# Patient Record
Sex: Female | Born: 1995
Health system: Southern US, Community
[De-identification: ages and names within clinical notes are randomized; demographics above are authoritative.]

## PROBLEM LIST (undated history)

## (undated) DIAGNOSIS — F411 Generalized anxiety disorder: Secondary | ICD-10-CM

## (undated) DIAGNOSIS — G43909 Migraine, unspecified, not intractable, without status migrainosus: Secondary | ICD-10-CM

## (undated) HISTORY — DX: Migraine, unspecified, not intractable, without status migrainosus: G43.909

## (undated) HISTORY — DX: Generalized anxiety disorder: F41.1

---
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
## 2017-01-18 DIAGNOSIS — Z23 Encounter for immunization: Secondary | ICD-10-CM | POA: Diagnosis not present

## 2017-01-22 DIAGNOSIS — J209 Acute bronchitis, unspecified: Secondary | ICD-10-CM | POA: Diagnosis not present

## 2017-01-22 DIAGNOSIS — R509 Fever, unspecified: Secondary | ICD-10-CM | POA: Diagnosis not present

## 2017-02-08 DIAGNOSIS — L298 Other pruritus: Secondary | ICD-10-CM | POA: Diagnosis not present

## 2017-02-08 DIAGNOSIS — L853 Xerosis cutis: Secondary | ICD-10-CM | POA: Diagnosis not present

## 2017-03-04 ENCOUNTER — Ambulatory Visit: Payer: Self-pay | Admitting: Family Medicine

## 2017-03-05 ENCOUNTER — Ambulatory Visit: Payer: 59 | Admitting: Internal Medicine

## 2017-03-05 ENCOUNTER — Encounter: Payer: Self-pay | Admitting: Internal Medicine

## 2017-03-05 VITALS — BP 110/66 | HR 86 | Temp 98.2°F | Wt 114.0 lb

## 2017-03-05 DIAGNOSIS — L03032 Cellulitis of left toe: Secondary | ICD-10-CM | POA: Diagnosis not present

## 2017-03-05 MED ORDER — CEPHALEXIN 500 MG PO CAPS: 500.0000 mg | ORAL_CAPSULE | Freq: Three times a day (TID) | ORAL | 0 refills | Status: DC

## 2017-03-05 NOTE — Patient Instructions (Signed)
Paronychia  Paronychia is an infection of the skin. It happens near a fingernail or toenail. It may cause pain and swelling around the nail. Usually, it is not serious and it clears up with treatment.  Follow these instructions at home:   Soak the fingers or toes in warm water as told by your doctor. You may be told to do this for 20 minutes, 2-3 times a day.   Keep the area dry when you are not soaking it.   Take medicines only as told by your doctor.   If you were given an antibiotic medicine, finish all of it even if you start to feel better.   Keep the affected area clean.   Do not try to drain a fluid-filled bump yourself.   Wear rubber gloves when putting your hands in water.   Wear gloves if your hands might touch cleaners or chemicals.   Follow your doctor's instructions about:  ? Wound care.  ? Bandage (dressing) changes and removal.  Contact a doctor if:   Your symptoms get worse or do not improve.   You have a fever or chills.   You have redness spreading from the affected area.   You have more fluid, blood, or pus coming from the affected area.   Your finger or knuckle is swollen or is hard to move.  This information is not intended to replace advice given to you by your health care provider. Make sure you discuss any questions you have with your health care provider.  Document Released: 03/28/2009 Document Revised: 09/15/2015 Document Reviewed: 03/17/2014  Elsevier Interactive Patient Education  2018 Elsevier Inc.

## 2017-03-05 NOTE — Progress Notes (Signed)
Subjective:    Patient ID: Ebony White, female    DOB: 07/14/1995, 21 y.o.   MRN: [id number]  HPI  Pt presents to the clinic today with c/o redness, swelling and discharge of her 4th toe, left foot. This started 2 days ago. She reports the discharge is yellow in color. The area is very itchy. She has tried antibiotic ointment with minimal relief.  Review of Systems  Past Medical History:  Diagnosis Date  . GAD (generalized anxiety disorder)   . Migraines     Current Outpatient Medications  Medication Sig Dispense Refill  . JUNEL FE 1/20 1-20 MG-MCG tablet      No current facility-administered medications for this visit.     No Known Allergies  Family History  Problem Relation Age of Onset  . Anxiety disorder Mother   . Migraines Mother   . Hypertension Mother   . Diabetes Father   . Anxiety disorder Maternal Grandmother   . Hypertension Maternal Grandfather   . Lung cancer Maternal Grandfather   . Diabetes Paternal Grandmother   . Ovarian cancer Paternal Grandmother     Social History   Socioeconomic History  . Marital status: Single    Spouse name: Not on file  . Number of children: Not on file  . Years of education: Not on file  . Highest education level: Not on file  Social Needs  . Financial resource strain: Not on file  . Food insecurity - worry: Not on file  . Food insecurity - inability: Not on file  . Transportation needs - medical: Not on file  . Transportation needs - non-medical: Not on file  Occupational History  . Not on file  Tobacco Use  . Smoking status: Never Smoker  . Smokeless tobacco: Never Used  Substance and Sexual Activity  . Alcohol use: No  . Drug use: No  . Sexual activity: No  Other Topics Concern  . Not on file  Social History Narrative   Single.    No children.   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   Works [place] [employer] and at [employer].     Constitutional: Denies  fever, malaise, fatigue, headache or abrupt weight changes.   Skin: Pt reports redness, swelling and redness around nailbed of 4th toe, left foot.    No other specific complaints in a complete review of systems (except as listed in HPI above).     Objective:   Physical Exam   BP 110/66   Pulse 86   Temp 98.2 F (36.8 C) (Oral)   Wt 114 lb (51.7 kg)   LMP 02/22/2017   SpO2 98%   BMI 19.27 kg/m  Wt Readings from Last 3 Encounters:  03/05/17 114 lb (51.7 kg) (25 %, Z= -0.68)*  05/28/16 109 lb (49.4 kg) (18 %, Z= -0.92)*  05/26/15 113 lb 5.1 oz (51.4 kg) (31 %, Z= -0.49)*   * Growth percentiles are based on CDC (Girls, 2-20 Years) data.    General: Appears her stated age, well developed, well nourished in NAD. Skin: Paronychia noted of 4th toe, left great toenail.  BMET    Component Value Date/Time   NA 140 05/26/2015 1312   K 4.1 05/26/2015 1312   CL 107 05/26/2015 1312   CO2 23 05/26/2015 1312   GLUCOSE 86 05/26/2015 1312   BUN 10 05/26/2015 1312   CREATININE 0.87 05/26/2015 1312   CALCIUM 8.9 05/26/2015  1312   GFRNONAA NOT CALCULATED 05/26/2015 1312   GFRAA NOT CALCULATED 05/26/2015 1312    Lipid Panel  No results found for: CHOL, TRIG, HDL, CHOLHDL, VLDL, LDLCALC  CBC    Component Value Date/Time   WBC 14.1 (H) 05/26/2015 1312   RBC 4.71 05/26/2015 1312   HGB 13.2 05/26/2015 1312   HCT 39.5 05/26/2015 1312   PLT 234 05/26/2015 1312   MCV 83.9 05/26/2015 1312   MCH 28.0 05/26/2015 1312   MCHC 33.4 05/26/2015 1312   RDW 12.7 05/26/2015 1312   LYMPHSABS 0.5 (L) 05/26/2015 1312   MONOABS 0.9 05/26/2015 1312   EOSABS 0.1 05/26/2015 1312   BASOSABS 0.0 05/26/2015 1312    Hgb A1C No results found for: HGBA1C         Assessment & Plan:   Paronychia: Left Foot:  Soak in Epsom salts 2 x day eRx for Keflex 500 mg TID x 7 days  Return precautions discussed Nicki ReaperBAITY, Medford Staheli, NP

## 2017-03-08 ENCOUNTER — Encounter: Payer: Self-pay | Admitting: Primary Care

## 2017-03-08 ENCOUNTER — Ambulatory Visit (INDEPENDENT_AMBULATORY_CARE_PROVIDER_SITE_OTHER): Payer: 59 | Admitting: Primary Care

## 2017-03-08 VITALS — BP 104/64 | HR 70 | Temp 98.5°F | Ht 64.5 in | Wt 114.4 lb

## 2017-03-08 DIAGNOSIS — F411 Generalized anxiety disorder: Secondary | ICD-10-CM

## 2017-03-08 MED ORDER — ESCITALOPRAM OXALATE 5 MG PO TABS: 5.0000 mg | ORAL_TABLET | Freq: Every day | ORAL | 1 refills | Status: DC

## 2017-03-08 NOTE — Assessment & Plan Note (Signed)
Did okay with therapy, needs someone closer. [place] in [place]. She'd also like to try medication, discussed different options. GAD 7 score of 11 today.   Rx for Lexapro 5 mg sent to pharmacy. Patient is to take 1/2 tablet daily for 8 days, then advance to 1 full tablet thereafter. We discussed possible side effects of headache, GI upset, drowsiness, and SI/HI. If thoughts of SI/HI develop, we discussed to present to the emergency immediately. Patient verbalized understanding.   Follow up in 6 weeks for re-evaluation.

## 2017-03-08 NOTE — Patient Instructions (Signed)
Stop by the front desk and speak with either Shirlee LimerickMarion or Zella BallRobin regarding your referral to therapy. Explain to them that you are already seeing Bambi and that you need someone closer.   Start escitalopram (Lexapro) 5 mg tablets for anxiety. Take 1 tablet by mouth once daily.  Schedule a follow up visit with me in 6 weeks for re-evaluation.  It was a pleasure to see you today!

## 2017-03-08 NOTE — Progress Notes (Signed)
Subjective:    Patient ID: Ebony White, female    DOB: 05/20/1995, 21 y.o.   MRN: [id number]  HPI  Ebony White is an 21 year old female with a history of anxiety disorder who presents today with anxiety.  She was last evaluated in February 2018 as a new patient who endorsed strong family history of GAD. Also with symptoms of anxiety with a GAD 7 score of 11 that day. We decided that she would benefit from therapy so a referral was placed.   Since her last visit she's seen a therapist. She went for 5-6 sessions and felt like she was making small improvements, but [place] now [place] Consulting [place] and [place] needing someone closer. Her last visit with the therapist was in April 2018. Her therapist thinks she has slight OCD symptoms. She continues to feel anxious, trembling of her hand, pulls her hair often. Symptoms are daily to every other day. GAD 7 score of 11 today. She denies depression, SI/HI.  Review of Systems  Respiratory: Negative for shortness of breath.   Cardiovascular: Negative for chest pain.  Psychiatric/Behavioral: Positive for sleep disturbance. The patient is nervous/anxious.        Past Medical History:  Diagnosis Date  . GAD (generalized anxiety disorder)   . Migraines      Social History   Socioeconomic History  . Marital status: Single    Spouse name: Not on file  . Number of children: Not on file  . Years of education: Not on file  . Highest education level: Not on file  Social Needs  . Financial resource strain: Not on file  . Food insecurity - worry: Not on file  . Food insecurity - inability: Not on file  . Transportation needs - medical: Not on file  . Transportation needs - non-medical: Not on file  Occupational History  . Not on file  Tobacco Use  . Smoking status: Never Smoker  . Smokeless tobacco: Never Used  Substance and Sexual Activity  . Alcohol use: No  . Drug use: No  . Sexual activity: No  Other Topics Concern  . Not on file    Social History Narrative   Single.    No children.   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   Works [place] [employer] and at [employer].    Past Surgical History:  Procedure Laterality Date  . APPENDECTOMY  2012    Family History  Problem Relation Age of Onset  . Anxiety disorder Mother   . Migraines Mother   . Hypertension Mother   . Diabetes Father   . Anxiety disorder Maternal Grandmother   . Hypertension Maternal Grandfather   . Lung cancer Maternal Grandfather   . Diabetes Paternal Grandmother   . Ovarian cancer Paternal Grandmother     No Known Allergies  Current Outpatient Medications on File Prior to Visit  Medication Sig Dispense Refill  . cephALEXin (KEFLEX) 500 MG capsule Take 1 capsule (500 mg total) 3 (three) times daily by mouth. 21 capsule 0  . JUNEL FE 1/20 1-20 MG-MCG tablet     . valACYclovir (VALTREX) 500 MG tablet Take 500 mg 2 (two) times daily by mouth.     No current facility-administered medications on file prior to visit.     BP 104/64   Pulse 70   Temp 98.5 F (36.9 C) (Oral)   Ht 5' 4.5" (1.638 m)  Wt 114 lb 6.4 oz (51.9 kg)   LMP 02/22/2017   SpO2 99%   BMI 19.33 kg/m    Objective:   Physical Exam  Constitutional: She appears well-nourished.  Neck: Neck supple.  Cardiovascular: Normal rate and regular rhythm.  Pulmonary/Chest: Effort normal and breath sounds normal.  Skin: Skin is warm and dry.  Psychiatric: She has a normal mood and affect.          Assessment & Plan:

## 2017-04-19 DIAGNOSIS — N760 Acute vaginitis: Secondary | ICD-10-CM | POA: Diagnosis not present

## 2017-04-24 ENCOUNTER — Ambulatory Visit: Payer: Self-pay | Admitting: Primary Care

## 2017-04-26 ENCOUNTER — Ambulatory Visit: Payer: 59 | Admitting: Psychology

## 2017-05-01 ENCOUNTER — Encounter: Payer: Self-pay | Admitting: Primary Care

## 2017-05-01 ENCOUNTER — Ambulatory Visit (INDEPENDENT_AMBULATORY_CARE_PROVIDER_SITE_OTHER): Payer: 59 | Admitting: Primary Care

## 2017-05-01 DIAGNOSIS — F411 Generalized anxiety disorder: Secondary | ICD-10-CM | POA: Diagnosis not present

## 2017-05-01 NOTE — Assessment & Plan Note (Signed)
Improved on Lexapro 5 mg. Recommended she switch her dose to the evening to avoid daytime drowsiness. Initiate with therapy as planned. She will update if drowsiness persists.

## 2017-05-01 NOTE — Progress Notes (Signed)
Subjective:    Patient ID: Ebony White, female    DOB: 04/10/1996, 22 y.o.   MRN: [id number]  HPI  Ms. Ebony White is a 22 year old female who presents today for follow up of anxiety.   She was last evaluated on 03/08/17 with complaints of anxiety.  GAD 7 score of 11 that day. She had been evaluated in February 2018 with symptoms of anxiety but believed she was able to manage well on her own. Given continued symptoms since February 2018 she was treated with Lexapro 5 mg and therapy.  Since her last visit she's not see the therapist, she is scheduled for her first visit this week. She thinks she's feeling better. She doesn't pick and pull at her hair as much, she doesn't shake as much. She has noticed increased drowsiness, she is taking her Lexapro in the morning. She denies SI/HI, GI upset.  2) Traveler Vaccination: She will be traveling to [place] in May [place] and is wondering about specific vaccinations. She will need Hepatitis A and Typhoid. She will be leaving May 11th. Last Tdap was 2011.  Review of Systems  Cardiovascular: Negative for chest pain and palpitations.  Psychiatric/Behavioral: Negative for suicidal ideas.       See HPI     Past Medical History:  Diagnosis Date  . GAD (generalized anxiety disorder)   . Migraines      Social History   Socioeconomic History  . Marital status: Single    Spouse name: Not on file  . Number of children: Not on file  . Years of education: Not on file  . Highest education level: Not on file  Social Needs  . Financial resource strain: Not on file  . Food insecurity - worry: Not on file  . Food insecurity - inability: Not on file  . Transportation needs - medical: Not on file  . Transportation needs - non-medical: Not on file  Occupational History  . Not on file  Tobacco Use  . Smoking status: Never Smoker  . Smokeless tobacco: Never Used  Substance and Sexual Activity  . Alcohol use: No  . Drug use: No  . Sexual activity: No    Other Topics Concern  . Not on file  Social History Narrative   Single.    No children.   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   Works [place] [employer] [place] and at [employer].    Past Surgical History:  Procedure Laterality Date  . APPENDECTOMY  2012    Family History  Problem Relation Age of Onset  . Anxiety disorder Mother   . Migraines Mother   . Hypertension Mother   . Diabetes Father   . Anxiety disorder Maternal Grandmother   . Hypertension Maternal Grandfather   . Lung cancer Maternal Grandfather   . Diabetes Paternal Grandmother   . Ovarian cancer Paternal Grandmother     No Known Allergies  Current Outpatient Medications on File Prior to Visit  Medication Sig Dispense Refill  . escitalopram (LEXAPRO) 5 MG tablet Take 1 tablet (5 mg total) daily by mouth. 30 tablet 1  . JUNEL FE 1/20 1-20 MG-MCG tablet     . metroNIDAZOLE (FLAGYL) 500 MG tablet Take 1 tablet by mouth 2 (two) times daily.  0  . valACYclovir (VALTREX) 500 MG tablet Take 500 mg 2 (two) times daily by mouth.     No current facility-administered medications on file prior to  visit.     BP (!) 98/56 (BP Location: Right Arm, Patient Position: Sitting, Cuff Size: Normal)   Pulse 66   Temp 98.2 F (36.8 C) (Oral)   Wt 114 lb 4 oz (51.8 kg)   LMP 04/03/2017   SpO2 98%   BMI 19.31 kg/m    Objective:   Physical Exam  Constitutional: She appears well-nourished.  Neck: Neck supple.  Cardiovascular: Normal rate and regular rhythm.  Pulmonary/Chest: Effort normal and breath sounds normal.  Skin: Skin is warm and dry.  Psychiatric: She has a normal mood and affect.          Assessment & Plan:  International Travel:  Will be going to [place] on May 11th. Based off of the CDC website she'll need Hepatitis A and Typhoid vaccination. Can do typhoid orally. All other vaccinations UTD. She will schedule a nurse visit 6 weeks prior to her  vacation.  Morrie Sheldon, NP

## 2017-05-01 NOTE — Patient Instructions (Signed)
Continue Lexapro 5 mg as discussed, try taking this in the evening.  Schedule a nurse visit for the first week in April 2019 for your vaccinations.   It was a pleasure to see you today!

## 2017-05-07 ENCOUNTER — Other Ambulatory Visit: Payer: Self-pay | Admitting: Primary Care

## 2017-05-07 DIAGNOSIS — F411 Generalized anxiety disorder: Secondary | ICD-10-CM

## 2017-06-04 ENCOUNTER — Telehealth: Payer: Self-pay | Admitting: Primary Care

## 2017-06-04 NOTE — Telephone Encounter (Signed)
Copied from CRM #[id number]. Topic: General - Other >> Jun 04, 2017  2:53 PM Cecelia ByarsGreen, Temeka L, RMA wrote: Reason for CRM: patient called to notify Dr. Chestine Sporelark that her insurance is requesting a prior authorization for the typhoid injection

## 2017-06-04 NOTE — Telephone Encounter (Signed)
FYI..Patient wanted to let Jae DireKate know that she will need a prior auth for the typhoid pills  Patient stated that she will call about a week for her nurse visit in May to give us time to do the prior auth

## 2017-06-05 NOTE — Telephone Encounter (Signed)
Noted, please notify patient that she'll have to have the typhoid pills one week prior to travel, so I recommend she give [place] at least three weeks notice prior to travel (2 weeks for PA, 1 week for her to take medication prior to travel). Also needs Hepatitis A vaccination which we have in the office.

## 2017-06-05 NOTE — Telephone Encounter (Signed)
Spoken and notified patient of Kate's comments. Patient verbalized understanding. 

## 2017-06-14 ENCOUNTER — Telehealth: Payer: Self-pay | Admitting: *Deleted

## 2017-06-14 NOTE — Telephone Encounter (Signed)
Please notify patient that I'm always happy to discuss during an office visit, please schedule at her convenience. I need more information.

## 2017-06-14 NOTE — Telephone Encounter (Signed)
Message left for patient to return my call.  

## 2017-06-14 NOTE — Telephone Encounter (Signed)
Copied from CRM [phone]. Topic: Referral - Request >> Jun 14, 2017 12:50 PM Ebony KalataMichael, [name], NT wrote: Patient is calling states she would like a referral to a specialist (not sure what kind). 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 Please contact patient for further information.

## 2017-06-18 NOTE — Telephone Encounter (Signed)
Patient advised and will call back tomorrow for an appointment.

## 2017-07-12 ENCOUNTER — Encounter: Payer: Self-pay | Admitting: Family Medicine

## 2017-07-12 ENCOUNTER — Ambulatory Visit (INDEPENDENT_AMBULATORY_CARE_PROVIDER_SITE_OTHER): Payer: 59 | Admitting: Family Medicine

## 2017-07-12 VITALS — BP 116/80 | HR 102 | Temp 102.5°F | Ht 64.51 in | Wt 116.4 lb

## 2017-07-12 DIAGNOSIS — R509 Fever, unspecified: Secondary | ICD-10-CM

## 2017-07-12 DIAGNOSIS — B349 Viral infection, unspecified: Secondary | ICD-10-CM

## 2017-07-12 LAB — POC INFLUENZA A&B (BINAX/QUICKVUE)
INFLUENZA A, POC: NEGATIVE
Influenza B, POC: NEGATIVE

## 2017-07-12 MED ORDER — OSELTAMIVIR PHOSPHATE 75 MG PO CAPS: 75.0000 mg | ORAL_CAPSULE | Freq: Two times a day (BID) | ORAL | 0 refills | Status: AC

## 2017-07-12 MED ORDER — PROMETHAZINE-DM 6.25-15 MG/5ML PO SYRP: 5.0000 mL | ORAL_SOLUTION | Freq: Four times a day (QID) | ORAL | 0 refills | Status: DC | PRN

## 2017-07-12 NOTE — Progress Notes (Signed)
Subjective:  Patient ID: Ebony White, female    DOB: 11/23/1995  Age: 22 y.o. MRN: [id number]  CC: Fever and Cough   HPI Ebony White presents for evaluation of a 1 day history of fever chills, headache nasal congestion, sore throat, cough myalgias and nausea.  She has no asthma history.  She works in a pharmacy.  She has 1 dose of Tylenol for this this morning.  She has had no facial pressure, teeth pain or wheezing.  Outpatient Medications Prior to Visit  Medication Sig Dispense Refill  . escitalopram (LEXAPRO) 5 MG tablet TAKE 1 TABLET (5 MG TOTAL) DAILY BY MOUTH. 30 tablet 1  . JUNEL FE 1/20 1-20 MG-MCG tablet     . metroNIDAZOLE (FLAGYL) 500 MG tablet Take 1 tablet by mouth 2 (two) times daily.  0  . valACYclovir (VALTREX) 500 MG tablet Take 500 mg 2 (two) times daily by mouth.     No facility-administered medications prior to visit.     ROS Review of Systems  Constitutional: Positive for chills, fatigue and fever. Negative for unexpected weight change.  HENT: Positive for congestion, postnasal drip, rhinorrhea and sore throat. Negative for ear pain, facial swelling, sinus pressure and sinus pain.   Eyes: Negative for photophobia and visual disturbance.  Respiratory: Positive for cough. Negative for chest tightness, shortness of breath and wheezing.   Cardiovascular: Negative.   Gastrointestinal: Positive for nausea. Negative for abdominal pain and vomiting.  Genitourinary: Negative.   Musculoskeletal: Positive for myalgias. Negative for arthralgias.  Skin: Negative for rash.  Allergic/Immunologic: Negative for immunocompromised state.  Neurological: Positive for headaches. Negative for weakness.  Hematological: Does not bruise/bleed easily.  Psychiatric/Behavioral: Negative.     Objective:  BP 116/80 (BP Location: Left Arm, Patient Position: Sitting, Cuff Size: Normal)   Pulse (!) 102   Temp (!) 102.5 F (39.2 C) (Oral)   Ht 5' 4.51" (1.639 m)   Wt 116 lb 6  oz (52.8 kg)   SpO2 97%   BMI 19.66 kg/m   BP Readings from Last 3 Encounters:  07/12/17 116/80  05/01/17 (!) 98/56  03/08/17 104/64    Wt Readings from Last 3 Encounters:  07/12/17 116 lb 6 oz (52.8 kg) (28 %, Z= -0.58)*  05/01/17 114 lb 4 oz (51.8 kg) (25 %, Z= -0.69)*  03/08/17 114 lb 6.4 oz (51.9 kg) (26 %, Z= -0.66)*   * Growth percentiles are based on CDC (Girls, 2-20 Years) data.    Physical Exam  Constitutional: She is oriented to person, place, and time. She appears well-developed and well-nourished. No distress.  HENT:  Head: Normocephalic and atraumatic.  Right Ear: External ear normal.  Left Ear: External ear normal.  Mouth/Throat: Oropharynx is clear and moist. No oropharyngeal exudate.  Eyes: Pupils are equal, round, and reactive to light. Right eye exhibits no discharge. Left eye exhibits no discharge. No scleral icterus.  Neck: Neck supple. No JVD present. No tracheal deviation present. No thyromegaly present.  Cardiovascular: Normal rate, regular rhythm and normal heart sounds.  Pulmonary/Chest: Effort normal and breath sounds normal. No stridor.  Abdominal: Bowel sounds are normal.  Lymphadenopathy:    She has no cervical adenopathy.  Neurological: She is alert and oriented to person, place, and time.  Skin: Skin is warm and dry. No rash noted. She is not diaphoretic.  Psychiatric: She has a normal mood and affect. Her behavior is normal.    Lab Results  Component Value Date  WBC 14.1 (H) 05/26/2015   HGB 13.2 05/26/2015   HCT 39.5 05/26/2015   PLT 234 05/26/2015   GLUCOSE 86 05/26/2015   ALT 14 05/26/2015   AST 22 05/26/2015   NA 140 05/26/2015   K 4.1 05/26/2015   CL 107 05/26/2015   CREATININE 0.87 05/26/2015   BUN 10 05/26/2015   CO2 23 05/26/2015    Dg Hand Complete Right  Result Date: 11/24/2016 CLINICAL DATA:  Right hand injury. EXAM: RIGHT HAND - COMPLETE 3+ VIEW COMPARISON:  None. FINDINGS: There is no evidence of fracture or  dislocation. There is no evidence of arthropathy or other focal bone abnormality. Soft tissues are unremarkable. IMPRESSION: Negative. Electronically Signed   By: Bary Richard M.D.   On: 11/24/2016 20:05    Assessment & Plan:   [name] was seen today for fever and cough.  Diagnoses and all orders for this visit:  Fever, unspecified fever cause -     POC Influenza A&B(BINAX/QUICKVUE)  Viral syndrome -     oseltamivir (TAMIFLU) 75 MG capsule; Take 1 capsule (75 mg total) by mouth 2 (two) times daily for 5 days. -     promethazine-dextromethorphan (PROMETHAZINE-DM) 6.25-15 MG/5ML syrup; Take 5 mLs by mouth 4 (four) times daily as needed for cough.   I am having Ebony White start on oseltamivir and promethazine-dextromethorphan. I am also having her maintain her JUNEL FE 1/20, valACYclovir, metroNIDAZOLE, and escitalopram.  Meds ordered this encounter  Medications  . oseltamivir (TAMIFLU) 75 MG capsule    Sig: Take 1 capsule (75 mg total) by mouth 2 (two) times daily for 5 days.    Dispense:  10 capsule    Refill:  0  . promethazine-dextromethorphan (PROMETHAZINE-DM) 6.25-15 MG/5ML syrup    Sig: Take 5 mLs by mouth 4 (four) times daily as needed for cough.    Dispense:  118 mL    Refill:  0     Follow-up: Return in about 1 week (around 07/19/2017), or if symptoms worsen or fail to improve.  Mliss Sax, MD

## 2017-07-12 NOTE — Patient Instructions (Signed)
Viral Illness, Adult Viruses are tiny germs that can get into a person's body and cause illness. There are many different types of viruses, and they cause many types of illness. Viral illnesses can range from mild to severe. They can affect various parts of the body. Common illnesses that are caused by a virus include colds and the flu. Viral illnesses also include serious conditions such as HIV/AIDS (human immunodeficiency virus/acquired immunodeficiency syndrome). A few viruses have been linked to certain cancers. What are the causes? Many types of viruses can cause illness. Viruses invade cells in your body, multiply, and cause the infected cells to malfunction or die. When the cell dies, it releases more of the virus. When this happens, you develop symptoms of the illness, and the virus continues to spread to other cells. If the virus takes over the function of the cell, it can cause the cell to divide and grow out of control, as is the case when a virus causes cancer. Different viruses get into the body in different ways. You can get a virus by:  Swallowing food or water that is contaminated with the virus.  Breathing in droplets that have been coughed or sneezed into the air by an infected person.  Touching a surface that has been contaminated with the virus and then touching your eyes, nose, or mouth.  Being bitten by an insect or animal that carries the virus.  Having sexual contact with a person who is infected with the virus.  Being exposed to blood or fluids that contain the virus, either through an open cut or during a transfusion.  If a virus enters your body, your body's defense system (immune system) will try to fight the virus. You may be at higher risk for a viral illness if your immune system is weak. What are the signs or symptoms? Symptoms vary depending on the type of virus and the location of the cells that it invades. Common symptoms of the main types of viral illnesses  include: Cold and flu viruses  Fever.  Headache.  Sore throat.  Muscle aches.  Nasal congestion.  Cough. Digestive system (gastrointestinal) viruses  Fever.  Abdominal pain.  Nausea.  Diarrhea. Liver viruses (hepatitis)  Loss of appetite.  Tiredness.  Yellowing of the skin (jaundice). Brain and spinal cord viruses  Fever.  Headache.  Stiff neck.  Nausea and vomiting.  Confusion or sleepiness. Skin viruses  Warts.  Itching.  Rash. Sexually transmitted viruses  Discharge.  Swelling.  Redness.  Rash. How is this treated? Viruses can be difficult to treat because they live within cells. Antibiotic medicines do not treat viruses because these drugs do not get inside cells. Treatment for a viral illness may include:  Resting and drinking plenty of fluids.  Medicines to relieve symptoms. These can include over-the-counter medicine for pain and fever, medicines for cough or congestion, and medicines to relieve diarrhea.  Antiviral medicines. These drugs are available only for certain types of viruses. They may help reduce flu symptoms if taken early. There are also many antiviral medicines for hepatitis and HIV/AIDS.  Some viral illnesses can be prevented with vaccinations. A common example is the flu shot. Follow these instructions at home: Medicines   Take over-the-counter and prescription medicines only as told by your health care provider.  If you were prescribed an antiviral medicine, take it as told by your health care provider. Do not stop taking the medicine even if you start to feel better.  Be aware   of when antibiotics are needed and when they are not needed. Antibiotics do not treat viruses. If your health care provider thinks that you may have a bacterial infection as well as a viral infection, you may get an antibiotic. ? Do not ask for an antibiotic prescription if you have been diagnosed with a viral illness. That will not make your  illness go away faster. ? Frequently taking antibiotics when they are not needed can lead to antibiotic resistance. When this develops, the medicine no longer works against the bacteria that it normally fights. General instructions  Drink enough fluids to keep your urine clear or pale yellow.  Rest as much as possible.  Return to your normal activities as told by your health care provider. Ask your health care provider what activities are safe for you.  Keep all follow-up visits as told by your health care provider. This is important. How is this prevented? Take these actions to reduce your risk of viral infection:  Eat a healthy diet and get enough rest.  Wash your hands often with soap and water. This is especially important when you are in public places. If soap and water are not available, use hand sanitizer.  Avoid close contact with friends and family who have a viral illness.  If you travel to areas where viral gastrointestinal infection is common, avoid drinking water or eating raw food.  Keep your immunizations up to date. Get a flu shot every year as told by your health care provider.  Do not share toothbrushes, nail clippers, razors, or needles with other people.  Always practice safe sex.  Contact a health care provider if:  You have symptoms of a viral illness that do not go away.  Your symptoms come back after going away.  Your symptoms get worse. Get help right away if:  You have trouble breathing.  You have a severe headache or a stiff neck.  You have severe vomiting or abdominal pain. This information is not intended to replace advice given to you by your health care provider. Make sure you discuss any questions you have with your health care provider. Document Released: 08/19/2015 Document Revised: 09/21/2015 Document Reviewed: 08/19/2015 Elsevier Interactive Patient Education  2018 [employer].  Viral Respiratory Infection A respiratory infection is  an illness that affects part of the respiratory system, such as the lungs, nose, or throat. Most respiratory infections are caused by either viruses or bacteria. A respiratory infection that is caused by a virus is called a viral respiratory infection. Common types of viral respiratory infections include:  A cold.  The flu (influenza).  A respiratory syncytial virus (RSV) infection.  How do I know if I have a viral respiratory infection? Most viral respiratory infections cause:  A stuffy or runny nose.  Yellow or green nasal discharge.  A cough.  Sneezing.  Fatigue.  Achy muscles.  A sore throat.  Sweating or chills.  A fever.  A headache.  How are viral respiratory infections treated? If influenza is diagnosed early, it may be treated with an antiviral medicine that shortens the length of time a person has symptoms. Symptoms of viral respiratory infections may be treated with over-the-counter and prescription medicines, such as:  Expectorants. These make it easier to cough up mucus.  Decongestant nasal sprays.  Health care providers do not prescribe antibiotic medicines for viral infections. This is because antibiotics are designed to kill bacteria. They have no effect on viruses. How do I know if I  should stay home from work or school? To avoid exposing others to your respiratory infection, stay home if you have:  A fever.  A persistent cough.  A sore throat.  A runny nose.  Sneezing.  Muscles aches.  Headaches.  Fatigue.  Weakness.  Chills.  Sweating.  Nausea.  Follow these instructions at home:  Rest as much as possible.  Take over-the-counter and prescription medicines only as told by your health care provider.  Drink enough fluid to keep your urine clear or pale yellow. This helps prevent dehydration and helps loosen up mucus.  Gargle with a salt-water mixture 3-4 times per day or as needed. To make a salt-water mixture, completely  dissolve -1 tsp of salt in 1 cup of warm water.  Use nose drops made from salt water to ease congestion and soften raw skin around your nose.  Do not drink alcohol.  Do not use tobacco products, including cigarettes, chewing tobacco, and e-cigarettes. If you need help quitting, ask your health care provider. Contact a health care provider if:  Your symptoms last for 10 days or longer.  Your symptoms get worse over time.  You have a fever.  You have severe sinus pain in your face or forehead.  The glands in your jaw or neck become very swollen. Get help right away if:  You feel pain or pressure in your chest.  You have shortness of breath.  You faint or feel like you will faint.  You have severe and persistent vomiting.  You feel confused or disoriented. This information is not intended to replace advice given to you by your health care provider. Make sure you discuss any questions you have with your health care provider. Document Released: 01/17/2005 Document Revised: 09/15/2015 Document Reviewed: 09/15/2014 Elsevier Interactive Patient Education  [employer]2018 [employer]

## 2017-07-16 ENCOUNTER — Ambulatory Visit: Payer: 59 | Admitting: Family Medicine

## 2017-07-16 ENCOUNTER — Encounter: Payer: Self-pay | Admitting: Family Medicine

## 2017-07-16 ENCOUNTER — Ambulatory Visit (INDEPENDENT_AMBULATORY_CARE_PROVIDER_SITE_OTHER): Payer: 59

## 2017-07-16 VITALS — BP 118/76 | HR 100 | Temp 98.9°F | Ht 64.51 in | Wt 116.4 lb

## 2017-07-16 DIAGNOSIS — J40 Bronchitis, not specified as acute or chronic: Secondary | ICD-10-CM | POA: Insufficient documentation

## 2017-07-16 DIAGNOSIS — R05 Cough: Secondary | ICD-10-CM | POA: Diagnosis not present

## 2017-07-16 LAB — CBC
HCT: 40.7 % (ref 36.0–49.0)
HEMOGLOBIN: 13.8 g/dL (ref 12.0–16.0)
MCHC: 33.8 g/dL (ref 31.0–37.0)
MCV: 85.2 fl (ref 78.0–98.0)
PLATELETS: 285 10*3/uL (ref 150.0–575.0)
RBC: 4.78 Mil/uL (ref 3.80–5.70)
RDW: 12.3 % (ref 11.4–15.5)
WBC: 11.6 10*3/uL (ref 4.5–13.5)

## 2017-07-16 MED ORDER — AZITHROMYCIN 250 MG PO TABS: ORAL_TABLET | ORAL | 0 refills | Status: DC

## 2017-07-16 MED ORDER — BENZONATATE 100 MG PO CAPS: 100.0000 mg | ORAL_CAPSULE | Freq: Three times a day (TID) | ORAL | 0 refills | Status: DC | PRN

## 2017-07-16 NOTE — Progress Notes (Signed)
Subjective:  Patient ID: Ebony White, female    DOB: 05/08/1995  Age: 22 y.o. MRN: [id number]  CC: Follow-up   HPI Ebony GinLauren A Soza presents for evaluation of persistent cough with fever.  The cough is productive of thick green mucus.  Altogether she is in her sixth day of this illness.  She was able to complete her course of Tamiflu.  Cough syrup is helping somewhat.  She has no  she has no wheezing or asthma history.  She does feel a burning in the upper chest area.  She continues with fatigue and some malaise.  Outpatient Medications Prior to Visit  Medication Sig Dispense Refill  . escitalopram (LEXAPRO) 5 MG tablet TAKE 1 TABLET (5 MG TOTAL) DAILY BY MOUTH. 30 tablet 1  . JUNEL FE 1/20 1-20 MG-MCG tablet     . oseltamivir (TAMIFLU) 75 MG capsule Take 1 capsule (75 mg total) by mouth 2 (two) times daily for 5 days. 10 capsule 0  . promethazine-dextromethorphan (PROMETHAZINE-DM) 6.25-15 MG/5ML syrup Take 5 mLs by mouth 4 (four) times daily as needed for cough. 118 mL 0  . valACYclovir (VALTREX) 500 MG tablet Take 500 mg 2 (two) times daily by mouth.    . metroNIDAZOLE (FLAGYL) 500 MG tablet Take 1 tablet by mouth 2 (two) times daily.  0   No facility-administered medications prior to visit.     ROS Review of Systems  Constitutional: Positive for chills, fatigue and fever.  HENT: Positive for congestion and sore throat. Negative for trouble swallowing and voice change.   Eyes: Negative for discharge and itching.  Respiratory: Positive for cough. Negative for chest tightness and wheezing.   Cardiovascular: Negative.   Gastrointestinal: Negative.   Musculoskeletal: Negative for arthralgias and myalgias.  Skin: Negative for rash and wound.  Allergic/Immunologic: Negative for immunocompromised state.  Neurological: Negative for weakness and headaches.  Hematological: Does not bruise/bleed easily.  Psychiatric/Behavioral: Negative.     Objective:  BP 118/76 (BP Location:  Right Arm, Patient Position: Sitting, Cuff Size: Normal)   Pulse 100   Temp 98.9 F (37.2 C) (Oral)   Ht 5' 4.51" (1.639 m)   Wt 116 lb 6 oz (52.8 kg)   LMP 07/10/2017   SpO2 99%   BMI 19.66 kg/m   BP Readings from Last 3 Encounters:  07/16/17 118/76  07/12/17 116/80  05/01/17 (!) 98/56    Wt Readings from Last 3 Encounters:  07/16/17 116 lb 6 oz (52.8 kg) (28 %, Z= -0.58)*  07/12/17 116 lb 6 oz (52.8 kg) (28 %, Z= -0.58)*  05/01/17 114 lb 4 oz (51.8 kg) (25 %, Z= -0.69)*   * Growth percentiles are based on CDC (Girls, 2-20 Years) data.    Physical Exam  Constitutional: She is oriented to person, place, and time. She appears well-developed and well-nourished. No distress.  HENT:  Head: Normocephalic and atraumatic.  Right Ear: External ear normal.  Left Ear: External ear normal.  Mouth/Throat: Oropharynx is clear and moist. No oropharyngeal exudate.  Eyes: Pupils are equal, round, and reactive to light. Conjunctivae are normal. Right eye exhibits no discharge. Left eye exhibits no discharge. No scleral icterus.  Neck: Neck supple. No JVD present. No tracheal deviation present. No thyromegaly present.  Cardiovascular: Normal rate, regular rhythm and normal heart sounds.  Pulmonary/Chest: Effort normal and breath sounds normal. No stridor. No respiratory distress. She has no wheezes. She has no rales.  Abdominal: Bowel sounds are normal.  Lymphadenopathy:  She has no cervical adenopathy.  Neurological: She is alert and oriented to person, place, and time.  Skin: Skin is warm and dry. She is not diaphoretic.  Psychiatric: She has a normal mood and affect. Her behavior is normal.    Lab Results  Component Value Date   WBC 14.1 (H) 05/26/2015   HGB 13.2 05/26/2015   HCT 39.5 05/26/2015   PLT 234 05/26/2015   GLUCOSE 86 05/26/2015   ALT 14 05/26/2015   AST 22 05/26/2015   NA 140 05/26/2015   K 4.1 05/26/2015   CL 107 05/26/2015   CREATININE 0.87 05/26/2015   BUN  10 05/26/2015   CO2 23 05/26/2015    Dg Hand Complete Right  Result Date: 11/24/2016 CLINICAL DATA:  Right hand injury. EXAM: RIGHT HAND - COMPLETE 3+ VIEW COMPARISON:  None. FINDINGS: There is no evidence of fracture or dislocation. There is no evidence of arthropathy or other focal bone abnormality. Soft tissues are unremarkable. IMPRESSION: Negative. Electronically Signed   By: Bary Richard M.D.   On: 11/24/2016 20:05    Assessment & Plan:   Chelsie was seen today for follow-up.  Diagnoses and all orders for this visit:  Bronchitis -     CBC -     DG Chest 2 View; Future -     DG Chest 2 View -     azithromycin (ZITHROMAX) 250 MG tablet; Take 2 today and then one each day until finished. -     benzonatate (TESSALON) 100 MG capsule; Take 1 capsule (100 mg total) by mouth 3 (three) times daily as needed for cough.   I have discontinued Ed A. Labrada's metroNIDAZOLE. I am also having her start on azithromycin and benzonatate. Additionally, I am having her maintain her JUNEL FE 1/20, valACYclovir, escitalopram, oseltamivir, and promethazine-dextromethorphan.  Meds ordered this encounter  Medications  . azithromycin (ZITHROMAX) 250 MG tablet    Sig: Take 2 today and then one each day until finished.    Dispense:  6 tablet    Refill:  0  . benzonatate (TESSALON) 100 MG capsule    Sig: Take 1 capsule (100 mg total) by mouth 3 (three) times daily as needed for cough.    Dispense:  20 capsule    Refill:  0    May hold.     Follow-up: Return in about 5 days (around 07/21/2017), or if symptoms worsen or fail to improve.  Mliss Sax, MD

## 2017-07-16 NOTE — Patient Instructions (Signed)

## 2017-07-17 ENCOUNTER — Encounter: Payer: Self-pay | Admitting: Family Medicine

## 2017-07-18 ENCOUNTER — Ambulatory Visit (HOSPITAL_COMMUNITY): Payer: Self-pay | Admitting: Licensed Clinical Social Worker

## 2017-07-18 ENCOUNTER — Ambulatory Visit: Payer: 59 | Admitting: Primary Care

## 2017-07-31 DIAGNOSIS — N760 Acute vaginitis: Secondary | ICD-10-CM | POA: Diagnosis not present

## 2017-08-14 ENCOUNTER — Telehealth: Payer: Self-pay

## 2017-08-14 DIAGNOSIS — Z23 Encounter for immunization: Secondary | ICD-10-CM

## 2017-08-14 MED ORDER — TYPHOID VACCINE PO CPDR: DELAYED_RELEASE_CAPSULE | ORAL | 0 refills | Status: DC

## 2017-08-14 NOTE — Telephone Encounter (Signed)
Noted. Rx for Typhoid vaccine sent to pharmacy. She is to take one capsule every other day: days 1, 3, 5, 7 for a total of 4 days. Must complete series one week prior to travel.

## 2017-08-14 NOTE — Telephone Encounter (Signed)
Spoken and notified patient of Kate Clark's comments. Patient verbalized understanding.  

## 2017-08-14 NOTE — Telephone Encounter (Signed)
As FYI pt has nurse visit scheduled on 08/15/17 at 9 AM and wants to pick up typhoid vaccine rx.

## 2017-08-15 ENCOUNTER — Ambulatory Visit: Payer: 59

## 2017-08-15 ENCOUNTER — Ambulatory Visit (INDEPENDENT_AMBULATORY_CARE_PROVIDER_SITE_OTHER): Payer: 59 | Admitting: *Deleted

## 2017-08-15 DIAGNOSIS — Z23 Encounter for immunization: Secondary | ICD-10-CM | POA: Diagnosis not present

## 2017-08-20 ENCOUNTER — Encounter (HOSPITAL_COMMUNITY): Payer: Self-pay | Admitting: Licensed Clinical Social Worker

## 2017-08-20 ENCOUNTER — Ambulatory Visit (INDEPENDENT_AMBULATORY_CARE_PROVIDER_SITE_OTHER): Payer: 59 | Admitting: Licensed Clinical Social Worker

## 2017-08-20 DIAGNOSIS — F411 Generalized anxiety disorder: Secondary | ICD-10-CM

## 2017-08-20 DIAGNOSIS — Z62810 Personal history of physical and sexual abuse in childhood: Secondary | ICD-10-CM | POA: Diagnosis not present
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
## 2017-09-19 ENCOUNTER — Ambulatory Visit: Payer: 59 | Admitting: Primary Care

## 2017-10-14 ENCOUNTER — Telehealth: Payer: Self-pay | Admitting: Primary Care

## 2017-10-14 NOTE — Telephone Encounter (Signed)
Copied from CRM [phone]. Topic: Quick Communication - See Telephone Encounter >> Oct 14, 2017  3:31 PM Terisa Starraylor, Brittany L wrote: CRM for notification. See Telephone encounter for: 10/14/17.  Patient said she had her 1st hep A shot on 4/25. She would like to know when she needs the 2nd dose?

## 2017-10-14 NOTE — Telephone Encounter (Signed)
I spoke with pt and advised the second Hep A should be at least 6 months after the 1st Hep A. Pt voiced understanding and will cb to schedule.

## 2017-10-15 ENCOUNTER — Ambulatory Visit (INDEPENDENT_AMBULATORY_CARE_PROVIDER_SITE_OTHER): Payer: 59 | Admitting: Licensed Clinical Social Worker

## 2017-10-15 DIAGNOSIS — F411 Generalized anxiety disorder: Secondary | ICD-10-CM

## 2017-10-17 ENCOUNTER — Other Ambulatory Visit: Payer: Self-pay

## 2017-10-17 ENCOUNTER — Encounter (HOSPITAL_COMMUNITY): Payer: Self-pay | Admitting: Licensed Clinical Social Worker

## 2017-10-17 ENCOUNTER — Ambulatory Visit (INDEPENDENT_AMBULATORY_CARE_PROVIDER_SITE_OTHER): Payer: 59 | Admitting: Family Medicine

## 2017-10-17 DIAGNOSIS — A09 Infectious gastroenteritis and colitis, unspecified: Secondary | ICD-10-CM | POA: Diagnosis not present

## 2017-10-17 MED ORDER — AZITHROMYCIN 250 MG PO TABS: ORAL_TABLET | ORAL | 0 refills | Status: DC

## 2017-10-17 NOTE — Patient Instructions (Signed)
Complete course of antibiotics.  Push fluids, return to regular diet as soon as possible.

## 2017-10-17 NOTE — Progress Notes (Signed)
   THERAPIST PROGRESS NOTE  Session Time: 11:10-12pm  Participation Level: Active  Behavioral Response: CasualAlertEuthymic  Type of Therapy: Individual Therapy  Treatment Goals addressed: Coping  Interventions: Motivational Interviewing  Summary: Ebony GinLauren A White is a 22 y.o. female who presents for her initial individual counseling appointment. Spent a considerable amount of time building a trusting, therapeutic relationship. Pt is a Consulting civil engineerstudent at [employer], lives at home with her parents and works at [employer]. She does not have a psychiatrist so will make an appt for her today at her request.  Her PCP has prescribed meds for her but she stopped taking them in the past. Pt reports she has no sleep problems but is lethargic all day. Pt assists in taking care of her grandmother. Pt has limited support and few friends. Pt reports she has symptoms of anxiety, feeling tired, pulling her hair. Pt is interested in having her own apartment, getting a job at [employer], traveling, having more friends, completing college.  Suicidal/Homicidal: Nowithout intent/plan  Therapist Response: Assessed pt's current functioning and reviewed progress. Assisted pt building a trusting, therapeutic relationship.   Plan: Return again in 2 weeks.  Diagnosis: Axis I: GAD    Charie Pinkus S, LCAS 10/15/17

## 2017-10-17 NOTE — Progress Notes (Signed)
   Subjective:    Patient ID: Ebony White, female    DOB: 10/26/1995, 22 y.o.   MRN: [id number]  HPI    22 year old female presetns following trip to [place] with abdominal pain, diarrhea , nausea and fever.   She reports returning from [place] 6[place]6/21. [place]   She first noted symptoms on the trip with ST, chest congestion, dry cough, fever 100F, achy and chills... Lasted 1 week. Started  Diarrhea, stomach ache at that time as well.. Used immodium and felt better.   In last week noted recurrence of symptoms. Bilateral lower abd cramping, losse stool, 4 BM a day. No emesis, but nausea. Headache.  Using tylenol No blood in stool.  While on trip.. Drank  Some nonboiled water the first week.   Multiple people with diarrhea on trip. Also she has only had one of hepatitis  A.. Next due 01/2018  Blood pressure 100/70, pulse 79, temperature 98.5 F (36.9 C), temperature source Oral, height 5' 4.5" (1.638 m), weight 111 lb 12 oz (50.7 kg), last menstrual period 09/28/2017.   Review of Systems  Constitutional: Positive for fatigue and fever.  HENT: Negative for congestion.   Eyes: Negative for pain.  Respiratory: Negative for cough and shortness of breath.   Cardiovascular: Negative for chest pain, palpitations and leg swelling.  Gastrointestinal: Positive for abdominal pain, diarrhea and nausea.  Genitourinary: Negative for dysuria and vaginal bleeding.  Musculoskeletal: Negative for back pain.  Neurological: Negative for syncope, light-headedness and headaches.  Psychiatric/Behavioral: Negative for dysphoric mood.       Objective:   Physical Exam  Constitutional: Vital signs are normal. She appears well-developed and well-nourished. She is cooperative.  Non-toxic appearance. She does not appear ill. No distress.  HENT:  Head: Normocephalic.  Right Ear: Hearing, tympanic membrane, external ear and ear canal normal. Tympanic membrane is not erythematous, not  retracted and not bulging.  Left Ear: Hearing, tympanic membrane, external ear and ear canal normal. Tympanic membrane is not erythematous, not retracted and not bulging.  Nose: No mucosal edema or rhinorrhea. Right sinus exhibits no maxillary sinus tenderness and no frontal sinus tenderness. Left sinus exhibits no maxillary sinus tenderness and no frontal sinus tenderness.  Mouth/Throat: Uvula is midline, oropharynx is clear and moist and mucous membranes are normal.  Eyes: Pupils are equal, round, and reactive to light. Conjunctivae, EOM and lids are normal. Lids are everted and swept, no foreign bodies found.  Neck: Trachea normal and normal range of motion. Neck supple. Carotid bruit is not present. No thyroid mass and no thyromegaly present.  Cardiovascular: Normal rate, regular rhythm, S1 normal, S2 normal, normal heart sounds, intact distal pulses and normal pulses. Exam reveals no gallop and no friction rub.  No murmur heard. Pulmonary/Chest: Effort normal and breath sounds normal. No tachypnea. No respiratory distress. She has no decreased breath sounds. She has no wheezes. She has no rhonchi. She has no rales.  Abdominal: Soft. Normal appearance and bowel sounds are normal. There is generalized tenderness.  Mild generalize abdominal soreness  Neurological: She is alert.  Skin: Skin is warm, dry and intact. No rash noted.  Psychiatric: Her speech is normal and behavior is normal. Judgment and thought content normal. Her mood appears not anxious. Cognition and memory are normal. She does not exhibit a depressed mood.          Assessment & Plan:

## 2017-10-29 ENCOUNTER — Ambulatory Visit (HOSPITAL_COMMUNITY): Payer: Self-pay | Admitting: Licensed Clinical Social Worker

## 2017-11-06 ENCOUNTER — Encounter: Payer: Self-pay | Admitting: Family Medicine

## 2017-11-06 NOTE — Assessment & Plan Note (Signed)
Push fluids, advance diet and complete antibiotics.

## 2017-11-12 ENCOUNTER — Ambulatory Visit (HOSPITAL_COMMUNITY): Payer: Self-pay | Admitting: Licensed Clinical Social Worker

## 2017-11-26 ENCOUNTER — Ambulatory Visit (HOSPITAL_COMMUNITY): Payer: Self-pay | Admitting: Licensed Clinical Social Worker

## 2017-12-10 ENCOUNTER — Ambulatory Visit (HOSPITAL_COMMUNITY): Payer: Self-pay | Admitting: Licensed Clinical Social Worker

## 2017-12-26 ENCOUNTER — Ambulatory Visit (HOSPITAL_COMMUNITY): Payer: Self-pay | Admitting: Psychiatry

## 2017-12-26 DIAGNOSIS — Z01419 Encounter for gynecological examination (general) (routine) without abnormal findings: Secondary | ICD-10-CM | POA: Diagnosis not present

## 2017-12-26 DIAGNOSIS — Z681 Body mass index (BMI) 19 or less, adult: Secondary | ICD-10-CM | POA: Diagnosis not present

## 2018-01-31 DIAGNOSIS — Z23 Encounter for immunization: Secondary | ICD-10-CM | POA: Diagnosis not present

## 2018-02-04 ENCOUNTER — Telehealth: Payer: Self-pay

## 2018-02-04 NOTE — Telephone Encounter (Signed)
Copied from CRM [id number]. Topic: General - Other >> Feb 04, 2018  9:52 AM Arlyss Gandy, NT wrote: Reason for CRM: Pt states that her immunzation record shows she has had 3 Hep A vaccines, 1 Hep B vaccine. She is wanting to know if she needs another Hep A or Hep B shot before she comes in for her appt in a few weeks.

## 2018-02-04 NOTE — Telephone Encounter (Signed)
Per immunization record pt received Hep A on 03/29/2005 and 01/06/10 at [facility]; pt received a Hep A adult at [facility] on 08/15/17.Marland KitchenPlease advise.

## 2018-02-04 NOTE — Telephone Encounter (Signed)
Johny Drilling, will you take a look at [facility] and see what's needed? Thanks.

## 2018-02-06 NOTE — Telephone Encounter (Signed)
Patient called back to follow up on previous crm # [id number] please advise

## 2018-02-07 NOTE — Telephone Encounter (Addendum)
Per DPR, left detail message of Graylon Gunning comments for patient to call back.  according to [facility], patient have completed the Hep A and Hep B series.

## 2018-02-17 ENCOUNTER — Telehealth: Payer: Self-pay

## 2018-02-17 NOTE — Telephone Encounter (Signed)
Please see telephone encounter on 02/04/2018. I have called patient with detail message regarding no need to complete 2nd Hep A. Patient completed the Hep A series according to [facility] on 01/06/2010 and 03/29/2005.

## 2018-02-17 NOTE — Telephone Encounter (Signed)
[facility] faxed note pt was returning a call and to cancel the 02/19/18 nurse visit appt. (done). Not sure who called pt.

## 2018-02-19 ENCOUNTER — Ambulatory Visit: Payer: Self-pay

## 2018-03-11 DIAGNOSIS — H5213 Myopia, bilateral: Secondary | ICD-10-CM | POA: Diagnosis not present

## 2018-05-03 IMAGING — DX DG HAND COMPLETE 3+V*R*
3 series · 3 of 3 positions shown · non-contrast
Comparison: None.

CLINICAL DATA: Right hand injury.

EXAM:
RIGHT HAND - COMPLETE 3+ VIEW

[hand pa]
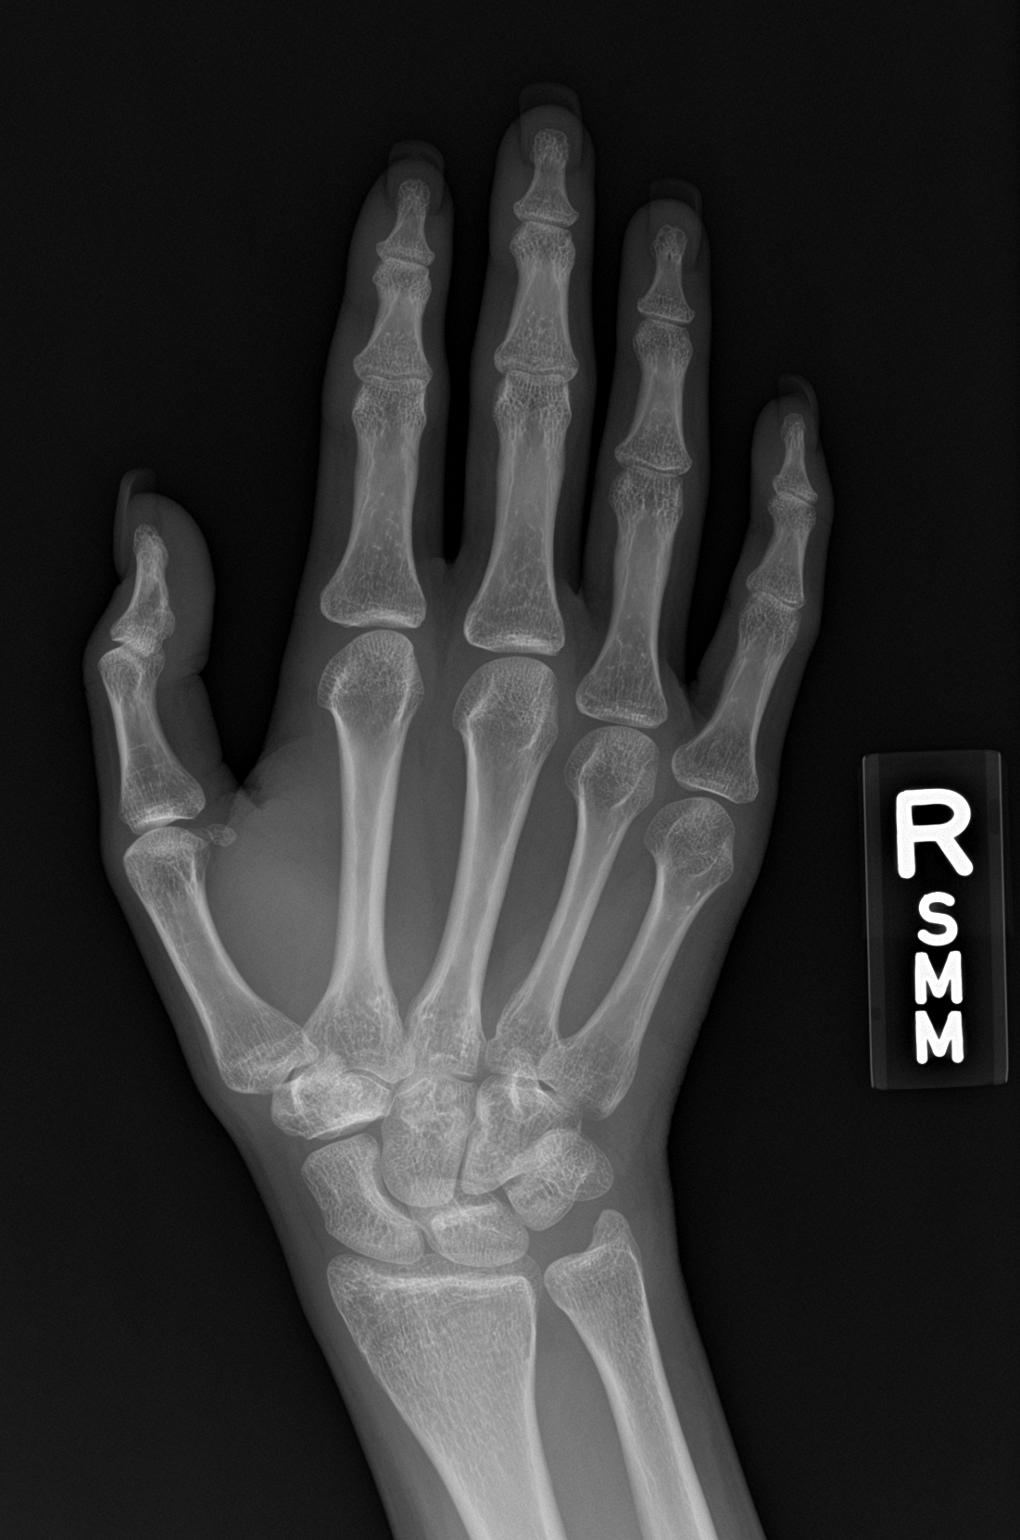

[hand obl]
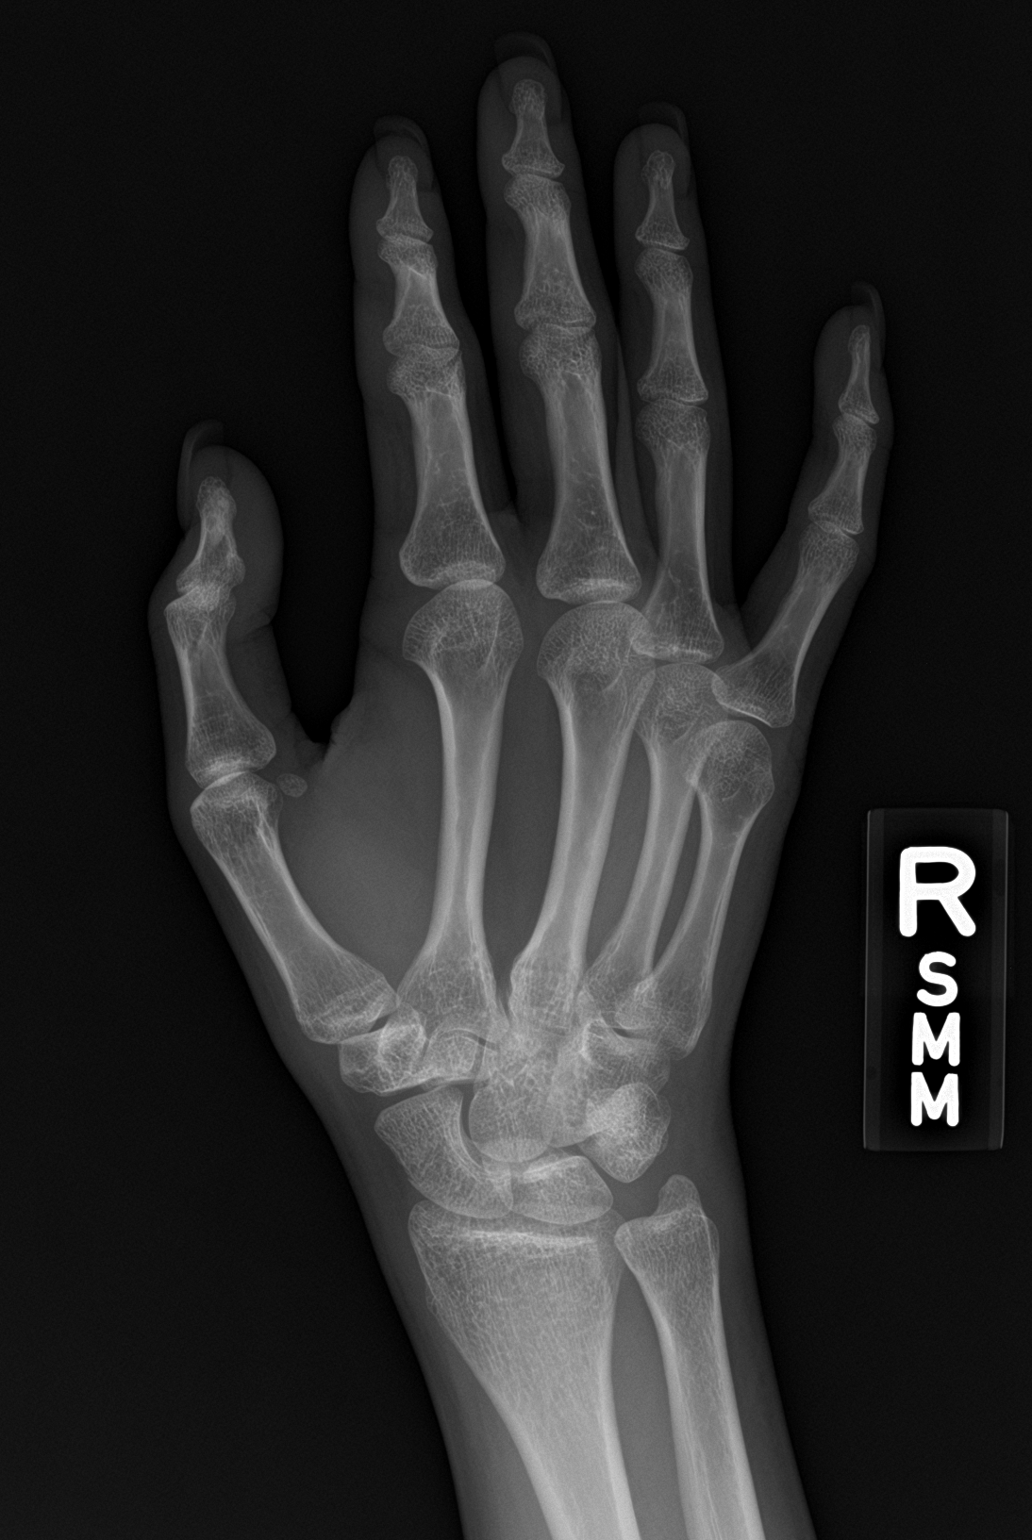

[hand lat]
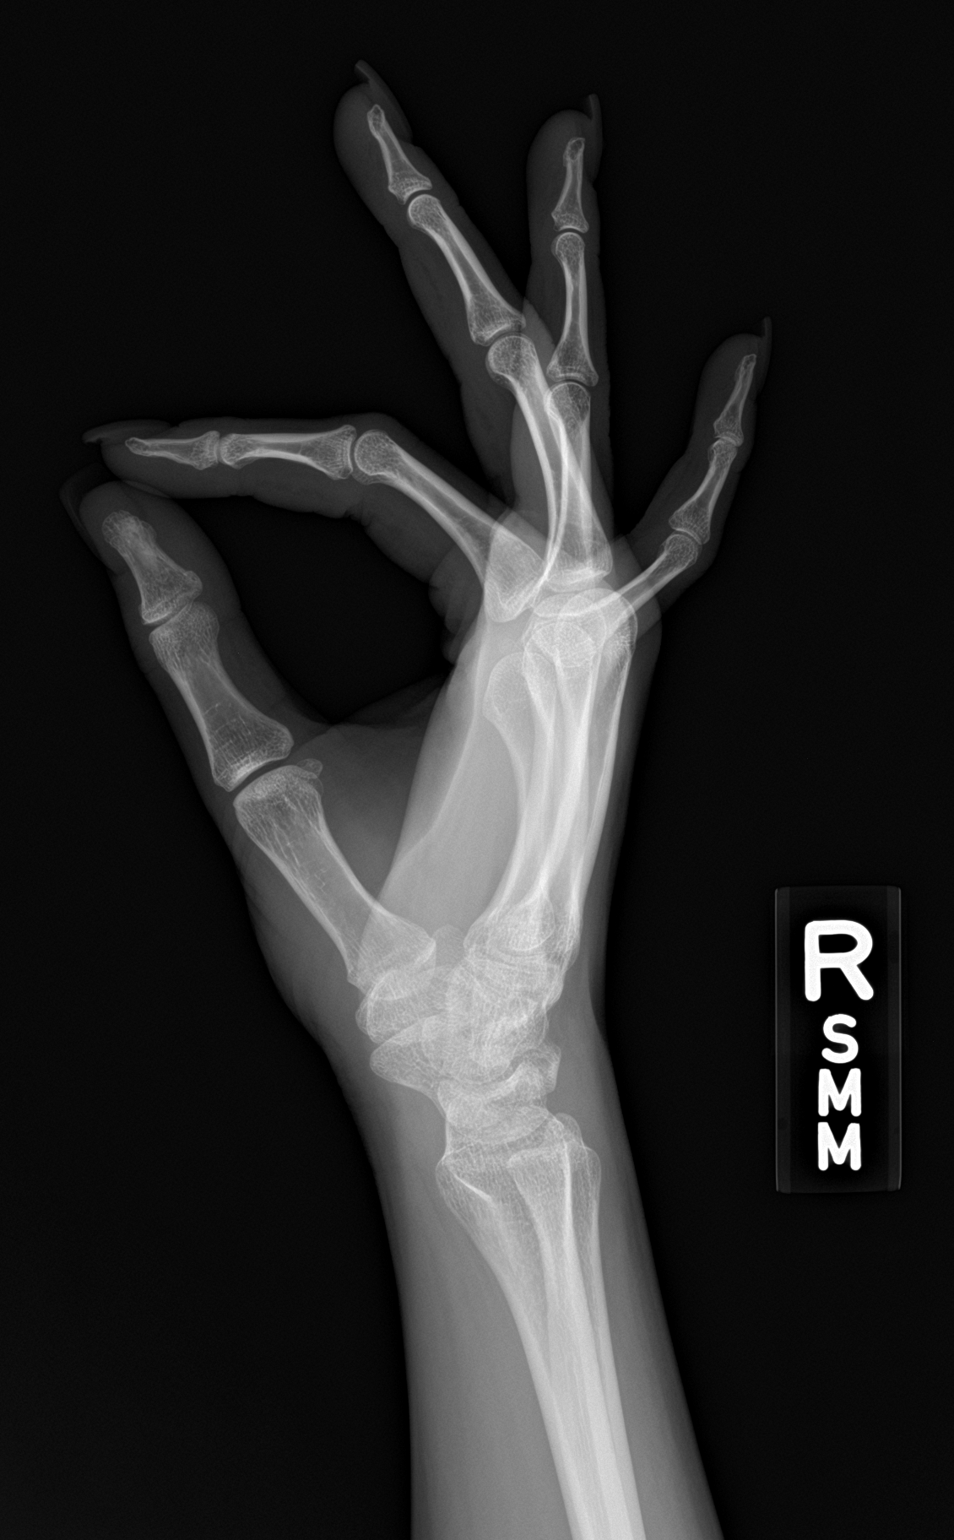

[3 of 3 positions shown; findings below may reference images not displayed]

FINDINGS: There is no evidence of fracture or dislocation. There is no
evidence of arthropathy or other focal bone abnormality. Soft
tissues are unremarkable.
IMPRESSION: Negative.

## 2018-07-07 ENCOUNTER — Telehealth: Payer: Self-pay | Admitting: Primary Care

## 2018-07-07 NOTE — Telephone Encounter (Signed)
I spoke with pt; pt is not having any fever or cough,for 2 days pt has had mid chest pressure and some SOB on and off. Pt is not having chest pressure or SOB now. Pt said she was at [facility] airport on 06[place]28[place]18 and pt thinks that is when someone was at [place] that tested positive for Covid. Pt does not know of any exposure to anyone with positive corona virus or flu. I spoke with Allayne Gitelman NP; pt does not meet criteria at this time for Covid testing; pt to monitor herself and if condition changes or worsens pt can call [facility].   pt can try OTC Tylenol or ibuprofen or Delsym or Robitussin. Pt voiced understanding and if symptoms change or worsen pt will call [facility]. Pt said that her boyfriend does not have a PCP and I advised should ck with UC. Pt voiced understanding.

## 2018-07-07 NOTE — Telephone Encounter (Signed)
Noted and agree. 

## 2018-07-07 NOTE — Telephone Encounter (Signed)
Best number to call [phone] Pt called stated out of country [address] of [place]  Arrive back in [place] 3/7 She now has tightness in chest  Scratchy throat her temp 98.6 Her boyfriend who is not pt at [facility] has same symptoms but has developed cough and nasal congestion his temp 99/5

## 2018-07-09 ENCOUNTER — Other Ambulatory Visit: Payer: Self-pay

## 2018-07-09 ENCOUNTER — Ambulatory Visit (INDEPENDENT_AMBULATORY_CARE_PROVIDER_SITE_OTHER): Payer: 59 | Admitting: Family Medicine

## 2018-07-09 ENCOUNTER — Encounter: Payer: Self-pay | Admitting: Family Medicine

## 2018-07-09 VITALS — BP 100/58 | HR 96 | Temp 98.0°F

## 2018-07-09 DIAGNOSIS — J029 Acute pharyngitis, unspecified: Secondary | ICD-10-CM | POA: Diagnosis not present

## 2018-07-09 DIAGNOSIS — R509 Fever, unspecified: Secondary | ICD-10-CM | POA: Diagnosis not present

## 2018-07-09 DIAGNOSIS — R05 Cough: Secondary | ICD-10-CM | POA: Diagnosis not present

## 2018-07-09 DIAGNOSIS — R059 Cough, unspecified: Secondary | ICD-10-CM

## 2018-07-09 LAB — POC INFLUENZA A&B (BINAX/QUICKVUE)
INFLUENZA B, POC: NEGATIVE
Influenza A, POC: NEGATIVE

## 2018-07-09 LAB — POCT RAPID STREP A (OFFICE): Rapid Strep A Screen: NEGATIVE

## 2018-07-09 MED ORDER — HYDROCOD POLST-CPM POLST ER 10-8 MG/5ML PO SUER: 5.0000 mL | Freq: Two times a day (BID) | ORAL | 0 refills | Status: DC | PRN
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
## 2018-07-09 NOTE — Telephone Encounter (Signed)
Noted  

## 2018-07-09 NOTE — Progress Notes (Signed)
Subjective:    Patient ID: Ebony White, female    DOB: Sep 18, 1995, 22 y.o.   MRN: [id number]  HPI This is a 23 yo female who presents today with 3 days of chest tightness, soreness. Had temp to 99. Has been taking ibuprofen and acetaminophen. Cough x 1-2 days, no sputum. Sore throat started yesterday and has been getting worse. No wheeze. No ear pain. Headache around both temples. No nasal congestion or drainage. No known sick contacts. Returned 06/28/18 from [place], , [place]. Went through [place] airport, some concern that person with COVID19 was there that same day. Her boyfriend who traveled with her has worse symptoms and is getting tested today.  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 She is also in school at [employer].  She admits to getting the flu every year. Has underlying anxiety. Poor water intake today.    Past Medical History:  Diagnosis Date  . GAD (generalized anxiety disorder)   . Migraines    Past Surgical History:  Procedure Laterality Date  . APPENDECTOMY  2012   Family History  Problem Relation Age of Onset  . Anxiety disorder Mother   . Migraines Mother   . Hypertension Mother   . Diabetes Father   . Anxiety disorder Maternal Grandmother   . Hypertension Maternal Grandfather   . Lung cancer Maternal Grandfather   . Diabetes Paternal Grandmother   . Ovarian cancer Paternal Grandmother    Social History   Tobacco Use  . Smoking status: Never Smoker  . Smokeless tobacco: Never Used  Substance Use Topics  . Alcohol use: No  . Drug use: No      Review of Systems Per HPI    Objective:   Physical Exam Vitals signs reviewed.  Constitutional:      General: She is not in acute distress.    Appearance: She is well-developed and normal weight. She is not ill-appearing, toxic-appearing or diaphoretic.  HENT:     Head: Normocephalic and atraumatic.     Nose: No congestion or rhinorrhea.     Mouth/Throat:    Mouth: Mucous membranes are moist.     Pharynx: Oropharynx is clear. Posterior oropharyngeal erythema present.     Tonsils: No tonsillar exudate or tonsillar abscesses.  Eyes:     Conjunctiva/sclera: Conjunctivae normal.  Neck:     Musculoskeletal: Normal range of motion and neck supple.  Cardiovascular:     Rate and Rhythm: Normal rate and regular rhythm.     Heart sounds: Normal heart sounds.  Pulmonary:     Effort: Pulmonary effort is normal. No respiratory distress.     Breath sounds: Normal breath sounds. No stridor. No wheezing, rhonchi or rales.     Comments: Frequent dry cough. Able to converse in complete sentences without difficulty. Ambulated to exam room from vehicle without difficulty or SOB.  Lymphadenopathy:     Cervical: No cervical adenopathy.  Skin:    General: Skin is warm and dry.  Neurological:     Mental Status: She is alert and oriented to person, place, and time.  Psychiatric:        Mood and Affect: Mood normal.        Behavior: Behavior normal.     Comments: Appears mildly anxious.        BP (!) 100/58 (BP Location: Left Arm, Patient Position: Sitting, Cuff Size: Normal)   Pulse 96   Temp 98 F (36.7  C) (Oral)   SpO2 98%      Results for orders placed or performed in visit on 07/09/18  Rapid Strep A  Result Value Ref Range   Rapid Strep A Screen Negative Negative  POC Influenza A&B(BINAX/QUICKVUE)  Result Value Ref Range   Influenza A, POC Negative Negative   Influenza B, POC Negative Negative    Assessment & Plan:  1. Fever of unknown origin - discussed symptoms and recent travel and does not meet criteria for COVID 19 screening at this time - suspect viral illness, continue symptomatic treatment, RTC precautions reveiwed - Rapid Strep A - POC Influenza A&B(BINAX/QUICKVUE)  2. Cough - Rapid Strep A - POC Influenza A&B(BINAX/QUICKVUE) - chlorpheniramine-HYDROcodone (TUSSIONEX PENNKINETIC ER) 10-8 MG/5ML SUER; Take 5 mLs by mouth every  12 (twelve) hours as needed for cough.  Dispense: 70 mL; Refill: 0  3. Sore throat - Rapid Strep A - POC Influenza A&B(BINAX/QUICKVUE)  -  Patient Instructions  Good to see you today  Please notify the office if your symptoms worsen, your boyfriend's test is positive for COVID-19 or if you have any questions or concerns  I have sent in Tussionex to your pharmacy if you need it for cough  Continue to take acetaminophen as needed for fever, aches   Olean Ree, FNP-BC  [facility], [facility]  07/09/2018 3:45 PM

## 2018-07-09 NOTE — Patient Instructions (Signed)
Good to see you today  Please notify the office if your symptoms worsen, your boyfriend's test is positive for COVID-19 or if you have any questions or concerns  I have sent in Tussionex to your pharmacy if you need it for cough  Continue to take acetaminophen as needed for fever, aches

## 2018-07-10 ENCOUNTER — Telehealth: Payer: Self-pay

## 2018-07-10 ENCOUNTER — Telehealth: Payer: 59 | Admitting: Family

## 2018-07-10 DIAGNOSIS — R05 Cough: Secondary | ICD-10-CM

## 2018-07-10 DIAGNOSIS — R059 Cough, unspecified: Secondary | ICD-10-CM

## 2018-07-10 MED ORDER — ALBUTEROL SULFATE HFA 108 (90 BASE) MCG/ACT IN AERS: 2.0000 | INHALATION_SPRAY | Freq: Four times a day (QID) | RESPIRATORY_TRACT | 0 refills | Status: DC | PRN

## 2018-07-10 MED ORDER — BENZONATATE 200 MG PO CAPS: 200.0000 mg | ORAL_CAPSULE | Freq: Three times a day (TID) | ORAL | 0 refills | Status: DC | PRN

## 2018-07-10 NOTE — Telephone Encounter (Signed)
Pt said she woke up this morning with a fever of 100.1 and chest tightness midsternal. She was told to call if she had these issues. She is taking tylenol 500mg  x2 last at 2am. I advised her she take another dose. She is asking what she needs to do. Boyfriend's test has not come back yet. Please call  [phone].

## 2018-07-10 NOTE — Telephone Encounter (Signed)
Called and spoke with patient. Continues to have cough, fever to 102. Taking tylenol. Had evist, albuterol prescribed, has not picked up. Able to talk in complete sentences. Cough noted. She was instructed to go to ER if she develops increased SOB. She verbalized understanding.

## 2018-07-10 NOTE — Progress Notes (Signed)
Greater than 5 minutes, yet less than 10 minutes of time have been spent researching, coordinating, and implementing care for this patient today.  Thank you for the details you included in the comment boxes. Those details are very helpful in determining the best course of treatment for you and help us to provide the best care.  E-Visit for Corona Virus Screening Based on your current symptoms, it seems unlikely that your symptoms are related to the Corona virus.   Coronavirus disease 2019 (COVID-19) is a respiratory illness that can spread from person to person. The virus that causes COVID-19 is a new virus that was first identified in the country of [place] but is now found in multiple other countries and has spread to the [place].  Symptoms associated with the virus are mild to severe fever, cough, and shortness of breath. There is currently no vaccine to protect against COVID-19, and there is no specific antiviral treatment for the virus.  It is vitally important that if you feel that you have an infection such as this virus or any other virus that you stay home and away from places where you may spread it to others.  Currently, not all patients are being tested. If the symptoms are mild and there is not a known exposure, performing the test is not indicated.  You can use medication such as A prescription cough medication called Tessalon Perles 100 mg. You may take 1-2 capsules every 8 hours as needed for cough and A prescription inhaler called Albuterol MDI 90 mcg /actuation 2 puffs every 4 hours as needed for shortness of breath, wheezing, cough  Reduce your risk of any infection by using the same precautions used for avoiding the common cold or flu:  . Wash your hands often with soap and warm water for at least 20 seconds.  If soap and water are not readily available, use an alcohol-based hand sanitizer with at least 60% alcohol.  . If coughing or sneezing, cover your mouth and nose by  coughing or sneezing into the elbow areas of your shirt or coat, into a tissue or into your sleeve (not your hands). . Avoid shaking hands with others and consider head nods or verbal greetings only. . Avoid touching your eyes, nose, or mouth with unwashed hands.  . Avoid close contact with people who are sick. . Avoid places or events with large numbers of people in one location, like concerts or sporting events. . Carefully consider travel plans you have or are making. . If you are planning any travel outside or inside the [place], visit the CDC's Travelers' Health webpage for the latest health notices. . If you have some symptoms but not all symptoms, continue to monitor at home and seek medical attention if your symptoms worsen. . If you are having a medical emergency, call 911.  HOME CARE . Only take medications as instructed by your medical team. . Drink plenty of fluids and get plenty of rest. . A steam or ultrasonic humidifier can help if you have congestion.   GET HELP RIGHT AWAY IF: . You develop worsening fever. . You become short of breath . You cough up blood. . Your symptoms become more severe MAKE SURE YOU   Understand these instructions.  Will watch your condition.  Will get help right away if you are not doing well or get worse.  Your e-visit answers were reviewed by a board certified advanced clinical practitioner to complete your personal care plan.    Depending on the condition, your plan could have included both over the counter or prescription medications.  If there is a problem please reply once you have received a response from your provider. Your safety is important to us.  If you have drug allergies check your prescription carefully.    You can use MyChart to ask questions about today's visit, request a non-urgent call back, or ask for a work or school excuse for 24 hours related to this e-Visit. If it has been greater than 24 hours you will need to follow up with  your provider, or enter a new e-Visit to address those concerns. You will get an e-mail in the next two days asking about your experience.  I hope that your e-visit has been valuable and will speed your recovery. Thank you for using e-visits.    

## 2018-09-04 ENCOUNTER — Inpatient Hospital Stay (HOSPITAL_COMMUNITY)
Admission: EM | Admit: 2018-09-04 | Discharge: 2018-09-04 | Disposition: A | Discharge status: Home / Self Care | Payer: 59 | Attending: Obstetrics and Gynecology | Admitting: Obstetrics and Gynecology

## 2018-09-04 ENCOUNTER — Other Ambulatory Visit: Payer: Self-pay

## 2018-09-04 ENCOUNTER — Encounter (HOSPITAL_COMMUNITY): Payer: Self-pay | Admitting: *Deleted

## 2018-09-04 DIAGNOSIS — R200 Anesthesia of skin: Secondary | ICD-10-CM | POA: Diagnosis not present

## 2018-09-04 DIAGNOSIS — Z3A00 Weeks of gestation of pregnancy not specified: Secondary | ICD-10-CM | POA: Diagnosis not present

## 2018-09-04 DIAGNOSIS — R202 Paresthesia of skin: Secondary | ICD-10-CM | POA: Diagnosis not present

## 2018-09-04 DIAGNOSIS — Z79899 Other long term (current) drug therapy: Secondary | ICD-10-CM | POA: Insufficient documentation

## 2018-09-04 DIAGNOSIS — O021 Missed abortion: Secondary | ICD-10-CM | POA: Diagnosis present

## 2018-09-04 NOTE — Discharge Instructions (Signed)
Paresthesia  Paresthesia is an abnormal burning or prickling sensation. It is usually felt in the hands, arms, legs, or feet. However, it may occur in any part of the body. Usually, paresthesia is not painful. It may feel like:  · Tingling or numbness.  · Pins and needles.  · Skin crawling.  · Buzzing.  · Arms or legs falling asleep.  · Itching.  Paresthesia may occur without any clear cause, or it may be caused by:  · Breathing too quickly (hyperventilation).  · Pressure on a nerve.  · An underlying medical condition.  · Side effects of a medication.  · Nutritional deficiencies.  · Exposure to toxic chemicals.  Most people experience temporary (transient) paresthesia at some time in their lives. For some people, it may be long-lasting (chronic) because of an underlying medical condition. If you have paresthesia that lasts a long time, you may need to be evaluated by your health care provider.  Follow these instructions at home:  Alcohol use    · Do not drink alcohol if:  ? Your health care provider tells you not to drink.  ? You are pregnant, may be pregnant, or are planning to become pregnant.  · If you drink alcohol, limit how much you have:  ? 0-1 drink a day for women.  ? 0-2 drinks a day for men.  · Be aware of how much alcohol is in your drink. In the [place] one drink equals one typical bottle of beer (12 oz), one-half glass of wine (5 oz), or one shot of hard liquor (1½ oz).  Nutrition  · Eat a healthy diet. This includes:  ? Eating foods that are high in fiber, such as fresh fruits and vegetables, whole grains, and beans.  ? Limiting foods that are high in fat and processed sugars, such as fried or sweet foods.  General instructions  · Take over-the-counter and prescription medicines only as told by your health care provider.  · Do not use any products that contain nicotine or tobacco, such as cigarettes and e-cigarettes. These can keep blood from reaching damaged nerves. If you need help quitting, ask your  health care provider.  · If you have diabetes, work closely with your health care provider to keep your blood sugar under control.  · If you have numbness in your feet:  ? Check every day for signs of injury or infection. Watch for redness, warmth, and swelling.  ? Wear padded socks and comfortable shoes. These help protect your feet.  · Keep all follow-up visits as told by your health care provider. This is important.  Contact a health care provider if you:  · Have paresthesia that gets worse or does not go away.  · Have a burning or prickling feeling that gets worse when you walk.  · Have pain, cramps, or dizziness.  · Develop a rash.  Get help right away if you:  · Feel weak.  · Have trouble walking or moving.  · Have problems with speech, understanding, or vision.  · Feel confused.  · Cannot control your bladder or bowel movements.  · Have numbness after an injury.  · Develop new weakness in an arm or leg.  · Faint.  Summary  · Paresthesia is an abnormal burning or prickling sensation that is usually felt in the hands, arms, legs, or feet. It may also occur in other parts of the body.  · Paresthesia may occur without any clear cause, or it may be   caused by breathing too quickly (hyperventilation), pressure on a nerve, an underlying medical condition, side effects of a medication, nutritional deficiencies, or exposure to toxic chemicals.  · If you have paresthesia that lasts a long time, you may need to be evaluated by your health care provider.  This information is not intended to replace advice given to you by your health care provider. Make sure you discuss any questions you have with your health care provider.  Document Released: 03/30/2002 Document Revised: 04/18/2017 Document Reviewed: 04/18/2017  Elsevier Interactive Patient Education © 2019 Elsevier Inc.

## 2018-09-04 NOTE — MAU Note (Signed)
Explained to pt would have to have her wait in lobby until provider was available to see her. PT voices understanding

## 2018-09-04 NOTE — MAU Note (Addendum)
Took Cytotec starting last night 800mg  for MAB. Took dose this am and will take next dose tonight. Has had vag bleeding today and used 2 pads. Some lower abd cramping which she knew would happen. Started having tingling/numb feeling R groin and thigh about 2-3hrs ago. Worse with walking. Tingling is moving down leg over time and pt was concerned. Did not come in for cramping or bleeding as she expected that from cytotec. Ambulates without difficulty

## 2018-09-04 NOTE — MAU Note (Signed)
Marie Williams CNM in Triage to see pt and discuss plan of care 

## 2018-09-04 NOTE — Progress Notes (Signed)
Wynelle Bourgeois CNM in earlier to see pt and discussed d/c plan. Written and verbal d/c instructions given and understanding voiced.

## 2018-09-04 NOTE — Progress Notes (Signed)
Provider made aware of pt's symptoms and concerns as documented in Triage. Will see her when possible in Triage

## 2018-09-04 NOTE — MAU Provider Note (Signed)
Chief Complaint: Groin Pain and leg tingling   First Provider Initiated Contact with Patient 09/04/18 2343        SUBJECTIVE HPI: Ebony White is a 23 y.o. G1P0 at Unknown by LMP who presents to maternity admissions reporting tingling sensation in right upper thigh today.  Has been taking doses of Cytotec for a missed abortion and was worried this was a side effect of that. Has had some light bleeding and cramping but no severe pain.  Did lie on her side all day and noticed numbness and tingling of right groin and anterior upper thigh.  No difficulty bearing weight.  No pain in leg. She denies vaginal itching/burning, urinary symptoms, h/a, dizziness, n/v, or fever/chills.    RN Note: Took Cytotec starting last night 800mg  for MAB. Took dose this am and will take next dose tonight. Has had vag bleeding today and used 2 pads. Some lower abd cramping which she knew would happen. Started having tingling/numb feeling R groin and thigh about 2-3hrs ago. Worse with walking. Tingling is moving down leg over time and pt was concerned. Did not come in for cramping or bleeding as she expected that from cytotec. Ambulates without difficulty  Past Medical History:  Diagnosis Date  . GAD (generalized anxiety disorder)   . Migraines    Past Surgical History:  Procedure Laterality Date  . APPENDECTOMY  2012   Social History   Socioeconomic History  . Marital status: Single    Spouse name: Not on file  . Number of children: Not on file  . Years of education: Not on file  . Highest education level: Not on file  Occupational History  . Not on file  Social Needs  . Financial resource strain: Not on file  . Food insecurity:    Worry: Not on file    Inability: Not on file  . Transportation needs:    Medical: Not on file    Non-medical: Not on file  Tobacco Use  . Smoking status: Never Smoker  . Smokeless tobacco: Never Used  Substance and Sexual Activity  . Alcohol use: No  . Drug use: No   . Sexual activity: Never  Lifestyle  . Physical activity:    Days per week: Not on file    Minutes per session: Not on file  . Stress: Not on file  Relationships  . Social connections:    Talks on phone: Not on file    Gets together: Not on file    Attends religious service: Not on file    Active member of club or organization: Not on file    Attends meetings of clubs or organizations: Not on file    Relationship status: Not on file  . Intimate partner violence:    Fear of current or ex partner: Not on file    Emotionally abused: Not on file    Physically abused: Not on file    Forced sexual activity: Not on file  Other Topics Concern  . Not on file  Social History Narrative   Single.    No children.   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   Works in [employer] and at [employer].   No current facility-administered medications on file prior to encounter.    Current Outpatient Medications on File Prior to Encounter  Medication Sig Dispense Refill  . albuterol (PROVENTIL HFA;VENTOLIN HFA) 108 (90 Base) MCG/ACT inhaler Inhale 2 puffs into  the lungs every 6 (six) hours as needed for shortness of breath. 1 Inhaler 0  . azithromycin (ZITHROMAX) 250 MG tablet 2 tab po x 1 day then 1 tab po daily 6 tablet 0  . benzonatate (TESSALON) 200 MG capsule Take 1 capsule (200 mg total) by mouth every 8 (eight) hours as needed for cough. 30 capsule 0  . chlorpheniramine-HYDROcodone (TUSSIONEX PENNKINETIC ER) 10-8 MG/5ML SUER Take 5 mLs by mouth every 12 (twelve) hours as needed for cough. 70 mL 0  . JUNEL FE 1/20 1-20 MG-MCG tablet     . valACYclovir (VALTREX) 500 MG tablet Take 500 mg 2 (two) times daily by mouth.     No Known Allergies  I have reviewed patient's Past Medical Hx, Surgical Hx, Family Hx, Social Hx, medications and allergies.   ROS:  Review of Systems  Constitutional: Negative for chills and fever.  Gastrointestinal: Positive for  abdominal pain. Negative for constipation, diarrhea and nausea.  Genitourinary: Positive for pelvic pain and vaginal bleeding. Negative for dysuria.  Musculoskeletal: Negative for gait problem, joint swelling and myalgias.       Tingling anterior right thigh and groin   Neurological: Positive for numbness. Negative for dizziness, tremors and weakness.   Review of Systems  Other systems negative   Physical Exam  Physical Exam Patient Vitals for the past 24 hrs:  BP Temp Temp src Pulse Resp SpO2 Height  09/04/18 2233 114/62 98.9 F (37.2 C) - 92 18 100 % -  09/04/18 2152 130/69 98.1 F (36.7 C) Oral 95 18 100 % 5\' 4"  (1.626 m)   Constitutional: Well-developed, well-nourished female in no acute distress.  Cardiovascular: normal rate Respiratory: normal effort GI: Abd soft, non-tender. Pos BS x 4 MS: Extremities nontender, no edema, normal ROM Neurologic: Alert and oriented x 4.  GU: Neg CVAT.  PELVIC EXAM: deferred  LAB RESULTS No results found for this or any previous visit (from the past 24 hour(s)).     IMAGING No results found.  MAU Management/MDM: Discussed the nature of paresthesia Discussed this is probably related to position of lying on her side today and will probably resolve on its own.  Encouraged changing of position and walking Reviewed expectations for Cytotec/miscarriage pain and bleeding Precautions reviewed  ASSESSMENT 1. Paresthesia of right leg   2. Missed abortion     PLAN Discharge home Supportive care with leg If she develops more symptoms or experiences weakness and inability to bear weight, needs eval by Orthopedics. Followup with OB as scheduled  Follow-up Information    Zelphia CairoAdkins, Gretchen, MD Follow up.   Specialty:  Obstetrics and Gynecology Contact information: [address], [address] [place] [id number] [phone]          Pt stable at time of discharge. Encouraged to return here or to other Urgent Care/ED if she  develops worsening of symptoms, increase in pain, fever, or other concerning symptoms.    Wynelle BourgeoisMarie Oluwadamilare Tobler CNM, MSN Certified Nurse-Midwife 09/04/2018  11:50 PM

## 2018-10-06 ENCOUNTER — Telehealth: Payer: Self-pay | Admitting: Primary Care

## 2018-10-06 NOTE — Telephone Encounter (Signed)
Patient called today to request the COVID Anti body test.  Do you order these for the patient or is this something they would need to go through someone else for ?     PATIENT'S BEST PHONE- [phone]

## 2018-10-06 NOTE — Telephone Encounter (Signed)
Please notify patient:  There is a Covid-19 antibody test however the test is not considered very accurate right now.  If you test positive for antibodies it could be a false positive because the antibodies are similar to the antibodies from other cold viruses.  If you have had COVID-19 and you have antibodies, we don't yet know if you this means you are immune from getting it again. If it is negative then that means you have not yet had coronovirus.  Either way I recommend that you wear a face mask when you are in public and wash hands frequently.    We hope to have better antibody testing late this year or early next year. If she's having active symptoms then needs to be tested for Covid.

## 2018-10-07 NOTE — Telephone Encounter (Signed)
Per DPR, left detail message of Kate Clark's comments for patient. 

## 2018-12-23 IMAGING — DX DG CHEST 2V
2 series · 2 of 2 positions shown · non-contrast
Comparison: [place] chest x-ray August 20, 2006

CLINICAL DATA: Onset of productive cough last week associated with
upper chest pressure and sharp pleuritic pain. Also fever and
chills. Nonsmoker.

EXAM:
CHEST - 2 VIEW

[chest pa]
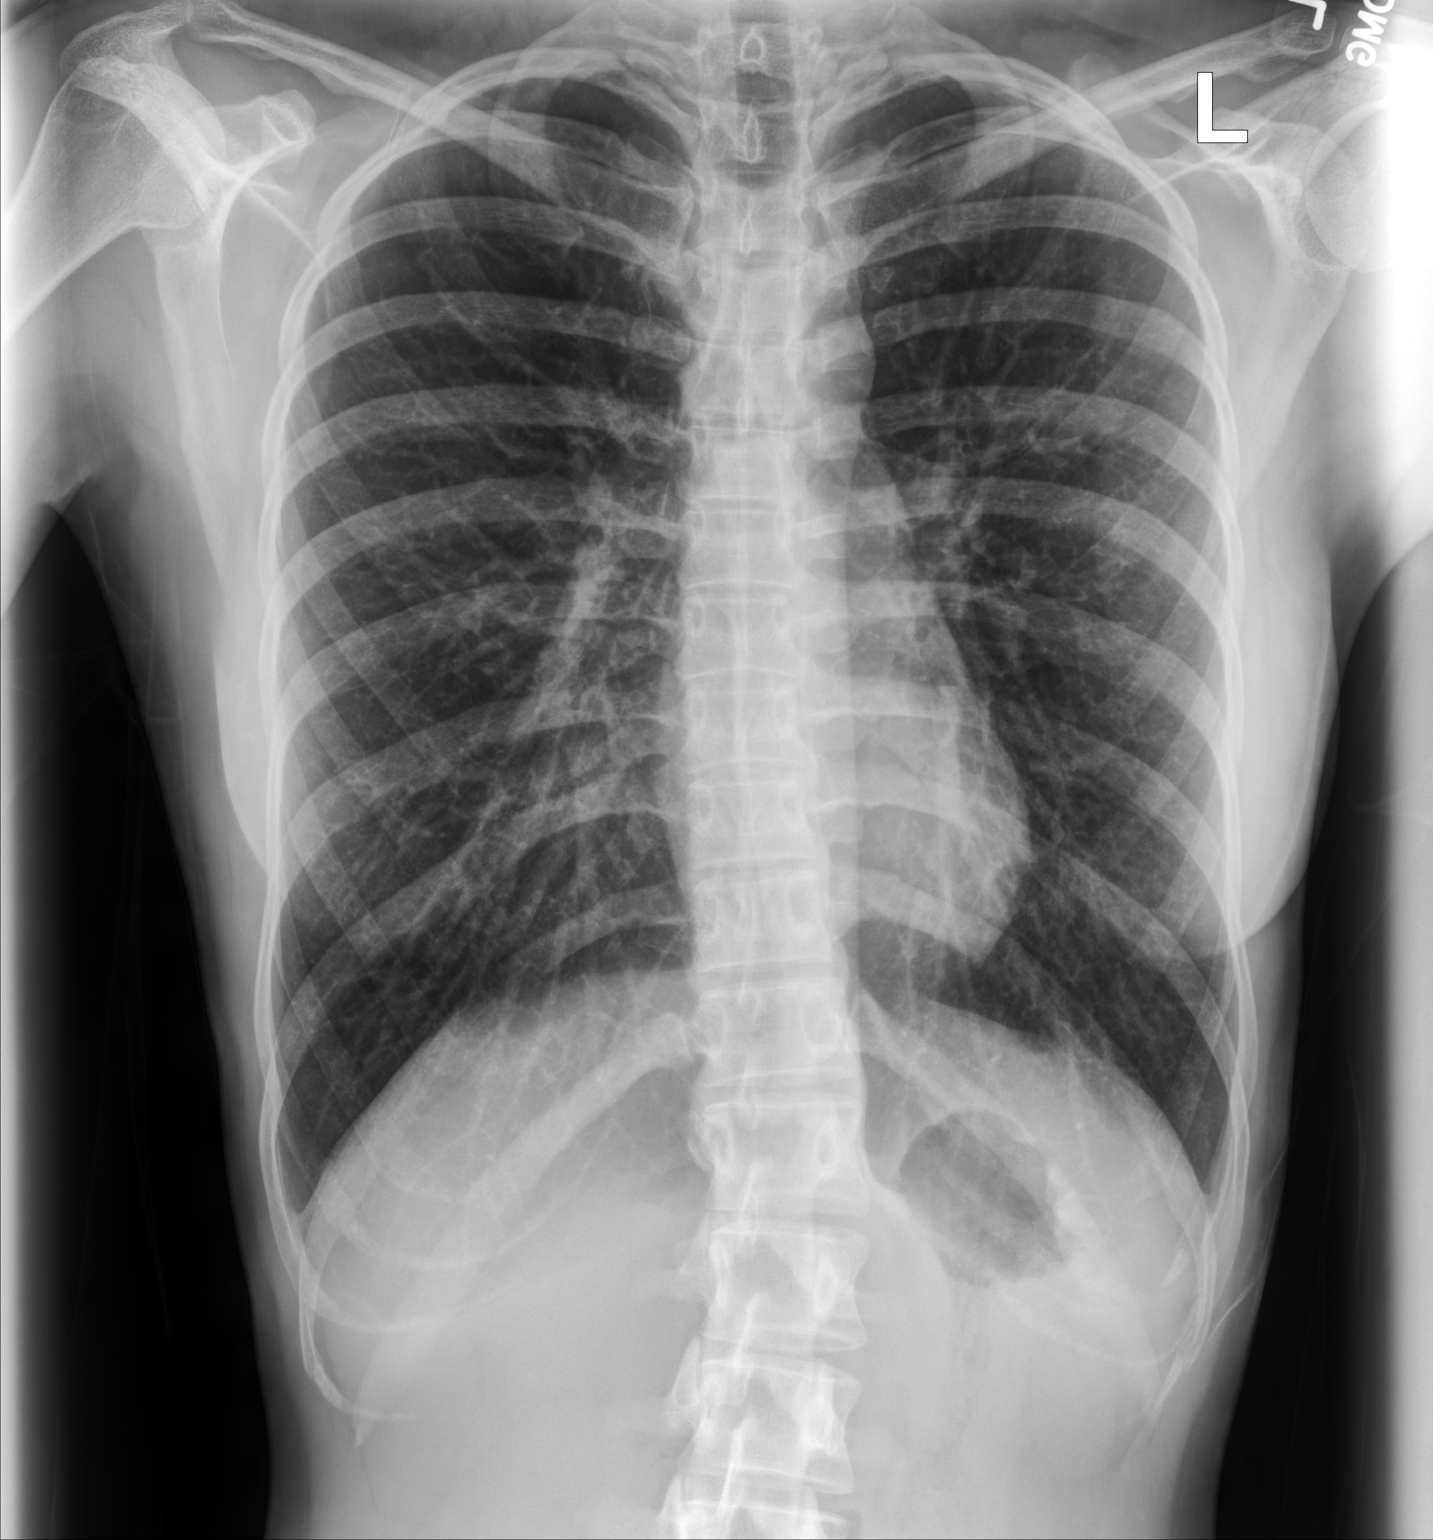

[chest lat]
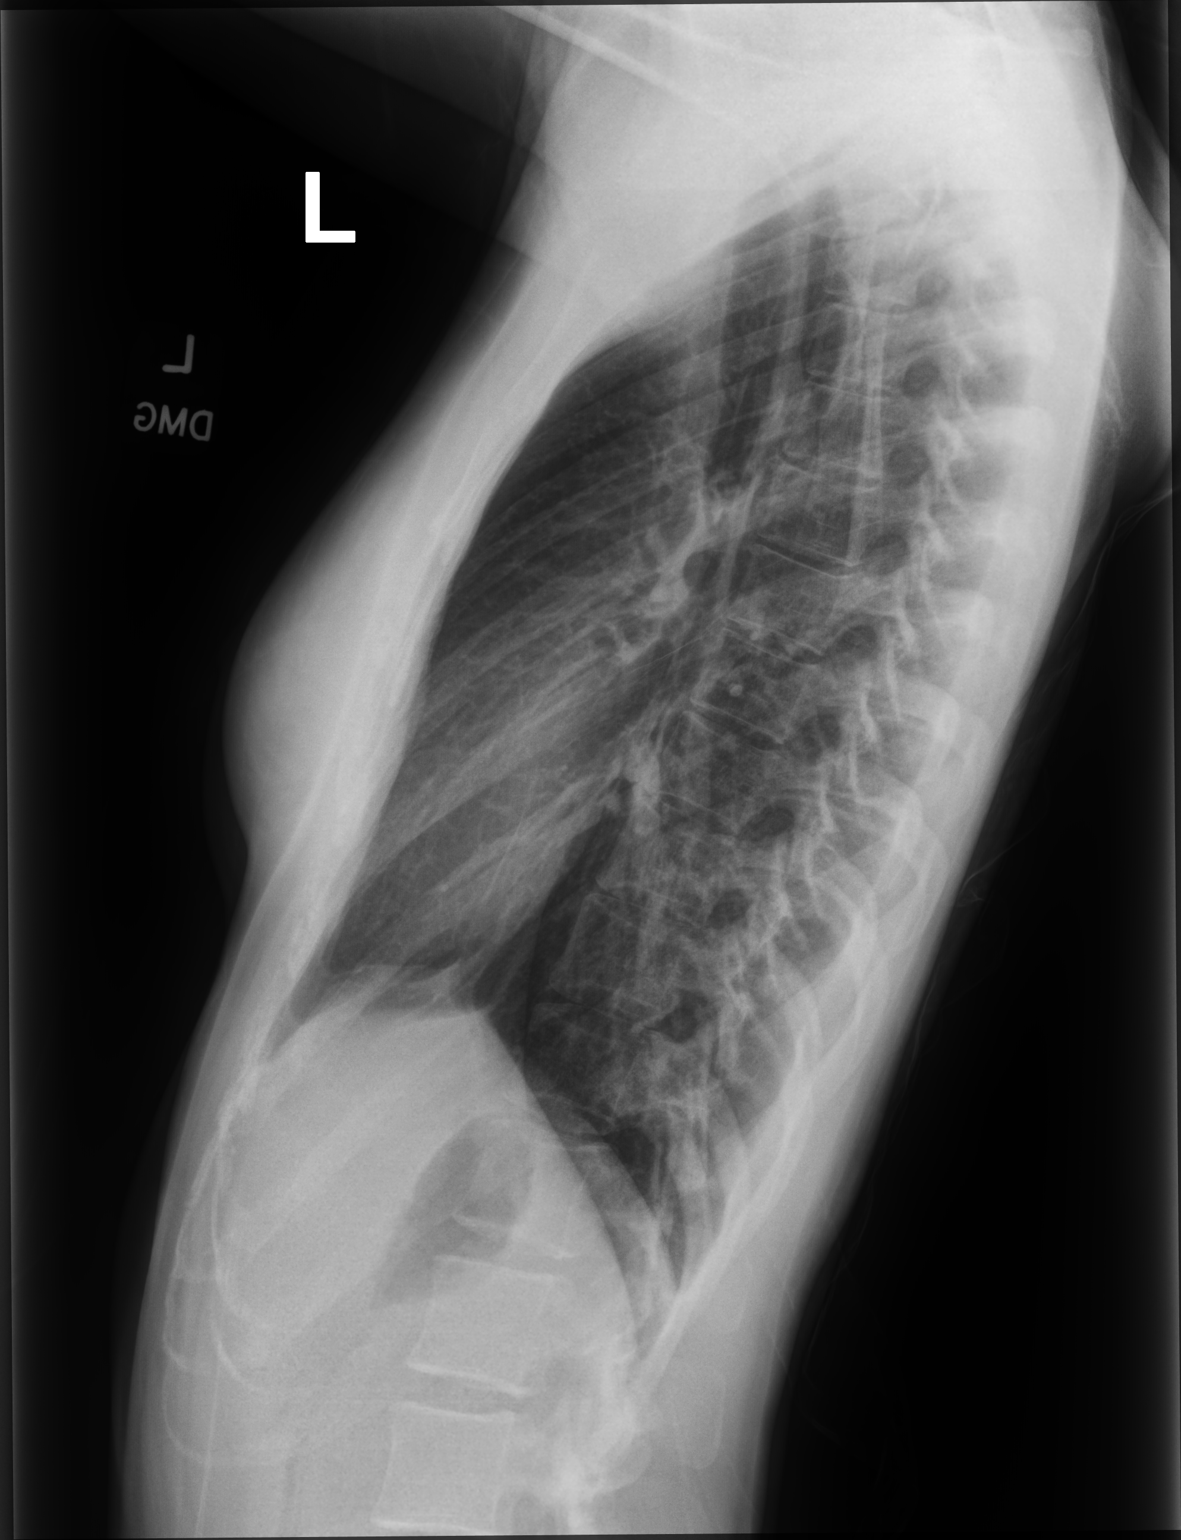

[2 of 2 positions shown; findings below may reference images not displayed]

FINDINGS: The lungs remain hyperinflated. The interstitial markings are coarse
in the retrocardiac region likely bilaterally. The upper lobes are
clear. The heart and pulmonary vascularity are normal. The
mediastinum is normal in width. The bony thorax exhibits no acute
abnormality.
IMPRESSION: Coarse lower lobe lung markings bilaterally likely reflects acute
bronchitis with subsegmental atelectasis. No alveolar pneumonia.
Follow-up radiographs are recommended following anticipated therapy
if the patient's symptoms do not completely resolve.

## 2019-01-30 DIAGNOSIS — R634 Abnormal weight loss: Secondary | ICD-10-CM | POA: Insufficient documentation

## 2019-01-30 DIAGNOSIS — F331 Major depressive disorder, recurrent, moderate: Secondary | ICD-10-CM | POA: Insufficient documentation

## 2019-04-14 DIAGNOSIS — A6000 Herpesviral infection of urogenital system, unspecified: Secondary | ICD-10-CM | POA: Insufficient documentation

## 2019-04-15 ENCOUNTER — Ambulatory Visit: Payer: 59 | Attending: Internal Medicine

## 2019-04-15 ENCOUNTER — Other Ambulatory Visit: Payer: Self-pay

## 2019-04-15 DIAGNOSIS — Z20822 Contact with and (suspected) exposure to covid-19: Secondary | ICD-10-CM

## 2019-04-17 LAB — NOVEL CORONAVIRUS, NAA: SARS-CoV-2, NAA: NOT DETECTED

## 2019-11-20 ENCOUNTER — Ambulatory Visit (HOSPITAL_COMMUNITY)
Admission: EM | Admit: 2019-11-20 | Discharge: 2019-11-20 | Disposition: A | Discharge status: Home / Self Care | Payer: 59 | Attending: Urgent Care | Admitting: Urgent Care

## 2019-11-20 ENCOUNTER — Encounter (HOSPITAL_COMMUNITY): Payer: Self-pay

## 2019-11-20 ENCOUNTER — Other Ambulatory Visit: Payer: Self-pay

## 2019-11-20 DIAGNOSIS — R102 Pelvic and perineal pain: Secondary | ICD-10-CM

## 2019-11-20 DIAGNOSIS — M545 Low back pain, unspecified: Secondary | ICD-10-CM

## 2019-11-20 DIAGNOSIS — N390 Urinary tract infection, site not specified: Secondary | ICD-10-CM

## 2019-11-20 DIAGNOSIS — Z3202 Encounter for pregnancy test, result negative: Secondary | ICD-10-CM | POA: Insufficient documentation

## 2019-11-20 DIAGNOSIS — H1032 Unspecified acute conjunctivitis, left eye: Secondary | ICD-10-CM

## 2019-11-20 DIAGNOSIS — Z20822 Contact with and (suspected) exposure to covid-19: Secondary | ICD-10-CM | POA: Diagnosis not present

## 2019-11-20 DIAGNOSIS — N3001 Acute cystitis with hematuria: Secondary | ICD-10-CM

## 2019-11-20 DIAGNOSIS — R300 Dysuria: Secondary | ICD-10-CM | POA: Insufficient documentation

## 2019-11-20 DIAGNOSIS — R109 Unspecified abdominal pain: Secondary | ICD-10-CM | POA: Insufficient documentation

## 2019-11-20 LAB — SARS CORONAVIRUS 2 (TAT 6-24 HRS): SARS Coronavirus 2: NEGATIVE

## 2019-11-20 LAB — POCT URINALYSIS DIP (DEVICE)
Bilirubin Urine: NEGATIVE
Glucose, UA: NEGATIVE mg/dL
Ketones, ur: NEGATIVE mg/dL
Nitrite: NEGATIVE
Protein, ur: NEGATIVE mg/dL
Specific Gravity, Urine: >=1.03 (ref 1.005–1.030)
Urobilinogen, UA: 0.2 mg/dL (ref 0.0–1.0)
pH: 6 (ref 5.0–8.0)

## 2019-11-20 LAB — POC URINE PREG, ED: Preg Test, Ur: NEGATIVE

## 2019-11-20 MED ORDER — NITROFURANTOIN MONOHYD MACRO 100 MG PO CAPS
100.0000 mg | ORAL_CAPSULE | Freq: Two times a day (BID) | ORAL | 0 refills | Status: DC
Start: 2019-11-20 — End: 2020-08-03

## 2019-11-20 MED ORDER — TOBRAMYCIN 0.3 % OP SOLN
1.0000 [drp] | OPHTHALMIC | 0 refills | Status: DC
Start: 2019-11-20 — End: 2020-08-03

## 2019-11-20 NOTE — Discharge Instructions (Addendum)
Come back tomorrow once your Covid test result is available and we will work on a letter for documentation for your travel.

## 2019-11-20 NOTE — ED Provider Notes (Signed)
[facility]   MRN: [id number] DOB: [birth year]  Subjective:   Ebony White is a 24 y.o. female presenting for recheck on persistent dysuria, lower pelvic pressure and abdominal pain, low back pain of the right side.  Patient has frequent UTIs, sees a urologist.  Usually does better with Macrobid but she was prescribed Bactrim, completed the course of it and still has symptoms and feels worse.  Her urologist was not able to see her in clinic given Covid exposure.  She also reports that she has had a fever.  This morning she woke up with matted left eye, red eye, photophobia and itching.  She does wear contact lenses, has not put them in today.  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  She is very concerned about a trip that she was planning, is supposed to be flying out in the next 3 to 4 days.  She is requesting paperwork be completed or a letter of documentation be given.  Regarding her Covid exposure, patient reports that this was recent in the last 2 to 3 days.  She was advised that her Covid test may be falsely negative.  Still given that her friend tested positive and she is planning on traveling, would like to pursue Covid test today.  Takes Junel, valacyclovir.  No Known Allergies  Past Medical History:  Diagnosis Date  . GAD (generalized anxiety disorder)   . Migraines      Past Surgical History:  Procedure Laterality Date  . APPENDECTOMY  2012    Family History  Problem Relation Age of Onset  . Anxiety disorder Mother   . Migraines Mother   . Hypertension Mother   . Diabetes Father   . Anxiety disorder Maternal Grandmother   . Hypertension Maternal Grandfather   . Lung cancer Maternal Grandfather   . Diabetes Paternal Grandmother   . Ovarian cancer Paternal Grandmother     Social History   Tobacco Use  . Smoking status: Never Smoker  . Smokeless tobacco: Never Used  Substance Use Topics  . Alcohol use: No  . Drug use: No     ROS   Objective:   Vitals: BP 122/75 (BP Location: Left Arm)   Pulse 102   Temp 99 F (37.2 C) (Oral)   Resp 20   SpO2 100%   Breastfeeding Unknown   Physical Exam Constitutional:      General: She is not in acute distress.    Appearance: Normal appearance. She is well-developed and normal weight. She is not ill-appearing, toxic-appearing or diaphoretic.  HENT:     Head: Normocephalic and atraumatic.     Right Ear: External ear normal.     Left Ear: External ear normal.     Nose: Nose normal.     Mouth/Throat:     Mouth: Mucous membranes are moist.     Pharynx: Oropharynx is clear.  Eyes:     General: No scleral icterus.    Extraocular Movements: Extraocular movements intact.     Pupils: Pupils are equal, round, and reactive to light.  Cardiovascular:     Rate and Rhythm: Normal rate and regular rhythm.     Pulses: Normal pulses.     Heart sounds: Normal heart sounds. No murmur heard.  No friction rub. No gallop.   Pulmonary:     Effort: Pulmonary effort is normal. No respiratory distress.     Breath sounds: Normal breath sounds. No stridor. No wheezing, rhonchi or rales.  Abdominal:     General: Bowel sounds are normal. There is no distension.     Palpations: Abdomen is soft. There is no mass.     Tenderness: There is abdominal tenderness (lower, pressure sensation). There is no right CVA tenderness, left CVA tenderness, guarding or rebound.  Skin:    General: Skin is warm and dry.     Coloration: Skin is not pale.     Findings: No rash.  Neurological:     General: No focal deficit present.     Mental Status: She is alert and oriented to person, place, and time.  Psychiatric:        Mood and Affect: Mood normal.        Behavior: Behavior normal.        Thought Content: Thought content normal.        Judgment: Judgment normal.     Results for orders placed or performed during the hospital encounter of 11/20/19 (from the past 24 hour(s))  POCT urinalysis  dip (device)     Status: Abnormal   Collection Time: 11/20/19  1:42 PM  Result Value Ref Range   Glucose, UA NEGATIVE NEGATIVE mg/dL   Bilirubin Urine NEGATIVE NEGATIVE   Ketones, ur NEGATIVE NEGATIVE mg/dL   Specific Gravity, Urine >=1.030 1.005 - 1.030   Hgb urine dipstick TRACE (A) NEGATIVE   pH 6.0 5.0 - 8.0   Protein, ur NEGATIVE NEGATIVE mg/dL   Urobilinogen, UA 0.2 0.0 - 1.0 mg/dL   Nitrite NEGATIVE NEGATIVE   Leukocytes,Ua LARGE (A) NEGATIVE  POC urine pregnancy     Status: None   Collection Time: 11/20/19  2:25 PM  Result Value Ref Range   Preg Test, Ur NEGATIVE NEGATIVE    Assessment and Plan :   PDMP not reviewed this encounter.  1. Acute cystitis with hematuria   2. Dysuria   3. Pelvic pressure in female   4. Acute right-sided low back pain without sciatica   5. Acute conjunctivitis of left eye, unspecified acute conjunctivitis type     Large leukocytes on urinalysis, negative pregnancy test.  Recommended Macrobid for her UTI she is doing better with this, urine culture pending.  Use tobramycin for left eye conjunctivitis.  Patient has clear heart and lung sounds, reassuring vital signs.  Counseled that she should return tomorrow with her paperwork and we may be able to provide documentation for her travel. Counseled patient on potential for adverse effects with medications prescribed/recommended today, ER and return-to-clinic precautions discussed, patient verbalized understanding.    Wallis Bamberg, [place] 11/20/19 1446

## 2019-11-20 NOTE — ED Triage Notes (Signed)
Pt presents with recurrent urinary tract infection, pt states she normally is seen at [facility] urology but she was running a fever so they would not see her.  Pt also has irritation & redness of left eye X 2 days.  Pt also had a covid exposure a few days ago with no known symptoms.

## 2019-11-21 ENCOUNTER — Telehealth (HOSPITAL_COMMUNITY): Payer: Self-pay | Admitting: Urgent Care

## 2019-11-21 LAB — URINE CULTURE: Culture: <10000 — AB

## 2019-11-21 NOTE — Telephone Encounter (Signed)
Patient had a completion of her paperwork for her COVID-19 negative test result and traveling.  This was completed and handed back over.

## 2019-11-23 ENCOUNTER — Ambulatory Visit: Payer: 59 | Admitting: Primary Care

## 2020-01-01 ENCOUNTER — Ambulatory Visit: Payer: 59 | Admitting: Primary Care

## 2020-01-01 DIAGNOSIS — Z0289 Encounter for other administrative examinations: Secondary | ICD-10-CM

## 2020-08-01 ENCOUNTER — Telehealth: Payer: Self-pay | Admitting: Primary Care

## 2020-08-01 NOTE — Telephone Encounter (Signed)
Patient is a Radio producer patient. She is complaining of having itchy throat and having "gluey" eyes. She is not having any other symptoms. Would it be ok for me to schedule her as an In office visit or a virtual visit. I have scheduled her for in office but can change it to virtual If I need too.Please advise. EM

## 2020-08-01 NOTE — Telephone Encounter (Signed)
I think in-office is ok

## 2020-08-03 ENCOUNTER — Encounter: Payer: Self-pay | Admitting: Family Medicine

## 2020-08-03 ENCOUNTER — Other Ambulatory Visit: Payer: Self-pay

## 2020-08-03 ENCOUNTER — Ambulatory Visit (INDEPENDENT_AMBULATORY_CARE_PROVIDER_SITE_OTHER): Payer: 59 | Admitting: Family Medicine

## 2020-08-03 VITALS — BP 90/60 | HR 63 | Temp 98.1°F | Ht 64.75 in | Wt 106.5 lb

## 2020-08-03 DIAGNOSIS — H0100A Unspecified blepharitis right eye, upper and lower eyelids: Secondary | ICD-10-CM

## 2020-08-03 DIAGNOSIS — H0100B Unspecified blepharitis left eye, upper and lower eyelids: Secondary | ICD-10-CM | POA: Diagnosis not present

## 2020-08-03 DIAGNOSIS — R0982 Postnasal drip: Secondary | ICD-10-CM | POA: Diagnosis not present

## 2020-08-03 DIAGNOSIS — J301 Allergic rhinitis due to pollen: Secondary | ICD-10-CM | POA: Diagnosis not present

## 2020-08-03 MED ORDER — OLOPATADINE HCL 0.2 % OP SOLN: 1.0000 [drp] | Freq: Every day | OPHTHALMIC | 3 refills | Status: DC

## 2020-08-03 MED ORDER — PREDNISONE 20 MG PO TABS: 20.0000 mg | ORAL_TABLET | Freq: Every day | ORAL | 0 refills | Status: DC

## 2020-08-03 MED ORDER — FLUTICASONE PROPIONATE 50 MCG/ACT NA SUSP
2.0000 | Freq: Every day | NASAL | 3 refills | Status: DC
Start: 2020-08-03 — End: 2020-09-13

## 2020-08-03 NOTE — Progress Notes (Signed)
Ebony Black T. Beva Remund, MD, CAQ Sports Medicine  Primary Care and Sports Medicine [facility] [address] [place], [address]  Phone: [phone]  FAX: [phone]  Ebony White - 25 y.o. female  MRN [id number]  Date of Birth: 08-16-1995  Date: 08/03/2020  PCP: Doreene Nest, NP  Referral: Doreene Nest, NP  Chief Complaint  Patient presents with  . Allergies  . Cough  . Sneezing  . Belepharitis    Eyes Matted together in mornings    This visit occurred during the SARS-CoV-2 public health emergency.  Safety protocols were in place, including screening questions prior to the visit, additional usage of staff PPE, and extensive cleaning of exam room while observing appropriate contact time as indicated for disinfecting solutions.   Subjective:   Ebony White is a 25 y.o. very pleasant female patient with Body mass index is 17.86 kg/m. who presents with the following:  She presents with some itchy eyes and a sore throat.  A few weeks ago she was coughing out quite a bit of mucus, and she had had that before, but is not really been like this.  Initially it was green, now this is been change and is really more like clear liquid.  She did try some Mucinex, and this may have helped a little bit.  She more recently woke up with her eyes matting bilaterally with itchy throat and itchy eyes as well as some coughing.  She does not feel like it is in deep chest, but she does feel some dripping down the back of her throat.  In the past, she has not really had allergies, but now she has tried Claritin, Claritin-D, as well as Zyrtec and Zyrtec-D.  She also tried Benadryl at night.  She has not tried any nasal steroids.  Normally does not have allergies.  Claritin d has tried Some mucinex Zyrtec and zyrtec d -  Benadryl at night did not help.  No smoker or vape  Allergy and redness eye drops  Review of Systems is noted in  the HPI, as appropriate  Objective:   BP 90/60   Pulse 63   Temp 98.1 F (36.7 C) (Temporal)   Ht 5' 4.75" (1.645 m)   Wt 106 lb 8 oz (48.3 kg)   SpO2 99%   BMI 17.86 kg/m   GEN: No acute distress; alert,appropriate. PULM: Breathing comfortably in no respiratory distress PSYCH: Normally interactive.  ENT: TMs are clear without any signs of fluid or bulging.  Nonred.  Does have boggy turbinates in both of her nares.  No lymphadenopathy. CV: RRR, no m/g/r  PULM: Normal respiratory rate, no accessory muscle use. No wheezes, crackles or rhonchi  Laboratory and Imaging Data:  Assessment and Plan:     ICD-10-CM   1. Blepharitis of upper and lower eyelids of both eyes, unspecified type  H01.00A    H01.00B   2. Seasonal allergic rhinitis due to pollen  J30.1   3. Post-nasal drip  R09.82    Classic allergy symptoms.  Acute on chronic with exacerbation.  Very bothersome right now.  I Ernie Hew give her a low-dose of some steroids to see if this will help calm her down acutely while her ongoing medications start to help.  Patient Instructions  ALLERGIES -Sneezing, runny and stuffy nose, itchy eyes, nose, throat, tears.  TREATMENT 1. Avoid offending allergen 2. Air filtration devices can reduce concentration of allergen 3. Intranasal steroid  sprays are the most effective control (Flonase and Nasacort are over the counter now.) 4. Anti-histamines. Claritin (generic loratadine), Zyrtec (generic certrizine), and Allegra (fexofenadine) are all over the counter now. Cheap at [employer], [employer], [employer]. Pick 1 - sometimes it is helpful to try and see which one helps you the most or vary it up some over time. 5. You can also do an extra tablet of Benadryl 25 mg at night if needed. It makes you somewhat drowsy. 6.  Different eye drops:  Generic Pataday (olopatadine) once a day - I am going to try to send to the pharmacy    Meds ordered this encounter  Medications  . Olopatadine HCl 0.2  % SOLN    Sig: Apply 1 drop to eye daily. Contacts must be out    Dispense:  2.5 mL    Refill:  3  . fluticasone (FLONASE) 50 MCG/ACT nasal spray    Sig: Place 2 sprays into both nostrils daily.    Dispense:  16 g    Refill:  3  . predniSONE (DELTASONE) 20 MG tablet    Sig: Take 1 tablet (20 mg total) by mouth daily with breakfast.    Dispense:  7 tablet    Refill:  0   Medications Discontinued During This Encounter  Medication Reason  . albuterol (PROVENTIL HFA;VENTOLIN HFA) 108 (90 Base) MCG/ACT inhaler Completed Course  . nitrofurantoin, macrocrystal-monohydrate, (MACROBID) 100 MG capsule Completed Course  . JUNEL FE 1/20 1-20 MG-MCG tablet Completed Course  . tobramycin (TOBREX) 0.3 % ophthalmic solution Completed Course   No orders of the defined types were placed in this encounter.   Follow-up: No follow-ups on file.  Signed,  Elpidio Galea. Kelan Pritt, MD   Outpatient Encounter Medications as of 08/03/2020  Medication Sig  . fluticasone (FLONASE) 50 MCG/ACT nasal spray Place 2 sprays into both nostrils daily.  Marland Kitchen levonorgestrel (KYLEENA) 19.5 MG IUD Kyleena 17.5 mcg/24 hrs (55yrs) 19.5mg  intrauterine device  Take 1 device by intrauterine route.  . Olopatadine HCl 0.2 % SOLN Apply 1 drop to eye daily. Contacts must be out  . predniSONE (DELTASONE) 20 MG tablet Take 1 tablet (20 mg total) by mouth daily with breakfast.  . valACYclovir (VALTREX) 500 MG tablet Take 500 mg 2 (two) times daily by mouth.  . [DISCONTINUED] albuterol (PROVENTIL HFA;VENTOLIN HFA) 108 (90 Base) MCG/ACT inhaler Inhale 2 puffs into the lungs every 6 (six) hours as needed for shortness of breath.  . [DISCONTINUED] JUNEL FE 1/20 1-20 MG-MCG tablet   . [DISCONTINUED] nitrofurantoin, macrocrystal-monohydrate, (MACROBID) 100 MG capsule Take 1 capsule (100 mg total) by mouth 2 (two) times daily.  . [DISCONTINUED] tobramycin (TOBREX) 0.3 % ophthalmic solution Place 1 drop into the left eye every 2 (two) hours.    No facility-administered encounter medications on file as of 08/03/2020.

## 2020-08-03 NOTE — Patient Instructions (Signed)
ALLERGIES -Sneezing, runny and stuffy nose, itchy eyes, nose, throat, tears.  TREATMENT 1. Avoid offending allergen 2. Air filtration devices can reduce concentration of allergen 3. Intranasal steroid sprays are the most effective control (Flonase and Nasacort are over the counter now.) 4. Anti-histamines. Claritin (generic loratadine), Zyrtec (generic certrizine), and Allegra (fexofenadine) are all over the counter now. Cheap at [employer], [employer], [employer]. Pick 1 - sometimes it is helpful to try and see which one helps you the most or vary it up some over time. 5. You can also do an extra tablet of Benadryl 25 mg at night if needed. It makes you somewhat drowsy. 6.  Different eye drops:  Generic Pataday (olopatadine) once a day - I am going to try to send to the pharmacy

## 2020-09-13 ENCOUNTER — Ambulatory Visit (INDEPENDENT_AMBULATORY_CARE_PROVIDER_SITE_OTHER): Payer: 59 | Admitting: Primary Care

## 2020-09-13 ENCOUNTER — Other Ambulatory Visit: Payer: Self-pay

## 2020-09-13 VITALS — BP 100/60 | HR 83 | Temp 98.3°F | Ht 65.0 in | Wt 112.0 lb

## 2020-09-13 DIAGNOSIS — R300 Dysuria: Secondary | ICD-10-CM | POA: Diagnosis not present

## 2020-09-13 DIAGNOSIS — R102 Pelvic and perineal pain unspecified side: Secondary | ICD-10-CM | POA: Insufficient documentation

## 2020-09-13 DIAGNOSIS — R41840 Attention and concentration deficit: Secondary | ICD-10-CM | POA: Diagnosis not present

## 2020-09-13 DIAGNOSIS — Z841 Family history of disorders of kidney and ureter: Secondary | ICD-10-CM

## 2020-09-13 DIAGNOSIS — Z87440 Personal history of urinary (tract) infections: Secondary | ICD-10-CM

## 2020-09-13 DIAGNOSIS — F909 Attention-deficit hyperactivity disorder, unspecified type: Secondary | ICD-10-CM | POA: Insufficient documentation

## 2020-09-13 HISTORY — DX: Pelvic and perineal pain: R10.2

## 2020-09-13 LAB — POC URINALSYSI DIPSTICK (AUTOMATED)
Bilirubin, UA: NEGATIVE
Blood, UA: NEGATIVE
Glucose, UA: NEGATIVE
Ketones, UA: NEGATIVE
Leukocytes, UA: NEGATIVE
Nitrite, UA: NEGATIVE
Protein, UA: NEGATIVE
Spec Grav, UA: 1.015 (ref 1.010–1.025)
Urobilinogen, UA: 0.2 E.U./dL
pH, UA: 7.5 (ref 5.0–8.0)

## 2020-09-13 NOTE — Assessment & Plan Note (Signed)
Acute for the last five days, now with urinary frequency.  UA today completely negative, no protein/leuks/blood/nitrites.  Discussed results with patient.   Recommended she touch base with her Urologist. Checking CMP today.

## 2020-09-13 NOTE — Progress Notes (Signed)
Subjective:    Patient ID: Ebony White, female    DOB: Jun 29, 1995, 25 y.o.   MRN: [id number]  HPI  Ebony White is a very pleasant 25 y.o. female with a history of GAD, MDD, migraines who presents today to discuss several issues.  1) Pelvic Pressure: Symptoms began five days ago with pelvic pressure. Yesterday she began to notice increased urinary frequency and darker colored urine. One year ago she was evaluated by Urology (per GYN) for recurrent UTI's, was prescribed a "strong" antibiotic 6 months ago (high dose of Macrobid and Bactrim DS at the time, no UTI symptoms since.   She denies hematuria. She did have proteinuria last year which was "3000+". She also underwent CT hematuria per Urology which was negative. She has a FH of renal disease in her mother.   2) Difficulty Concentrating: Symptoms include difficulty with focusing on tasks, feeling "jittery" at times, tremors to bilateral hands, procrastinates with school work, hard to stay on task. Symptoms have been noticeable over the last year. She believes that she may have ADHD, denies a personal history of ADHD but does have family members with the same history.   She mostly noticed her symptoms during sophomore year in college. She does experience intermittent anxiety which is situational, denies increased stress. She has had a difficult putting on weight.   Wt Readings from Last 3 Encounters:  09/13/20 112 lb (50.8 kg)  08/03/20 106 lb 8 oz (48.3 kg)  10/17/17 111 lb 12 oz (50.7 kg) (18 %, Z= -0.90)*   * Growth percentiles are based on CDC (Girls, 2-20 Years) data.     Review of Systems  Constitutional: Negative for chills and fever.  Genitourinary: Positive for frequency and pelvic pain. Negative for dysuria and flank pain.  Psychiatric/Behavioral: Positive for decreased concentration. The patient is not nervous/anxious.          Past Medical History:  Diagnosis Date  . GAD (generalized anxiety disorder)   .  Migraines     Social History   Socioeconomic History  . Marital status: Single    Spouse name: Not on file  . Number of children: Not on file  . Years of education: Not on file  . Highest education level: Not on file  Occupational History  . Not on file  Tobacco Use  . Smoking status: Never Smoker  . Smokeless tobacco: Never Used  Substance and Sexual Activity  . Alcohol use: No  . Drug use: No  . Sexual activity: Never  Other Topics Concern  . Not on file  Social History Narrative   Single.    No children.   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   Works in [employer] and at [employer].   Social Determinants of Health   Financial Resource Strain: Not on file  Food Insecurity: Not on file  Transportation Needs: Not on file  Physical Activity: Not on file  Stress: Not on file  Social Connections: Not on file  Intimate Partner Violence: Not on file    Past Surgical History:  Procedure Laterality Date  . APPENDECTOMY  2012    Family History  Problem Relation Age of Onset  . Anxiety disorder Mother   . Migraines Mother   . Hypertension Mother   . Diabetes Father   . Anxiety disorder Maternal Grandmother   . Hypertension Maternal Grandfather   . Lung cancer Maternal Grandfather   .  Diabetes Paternal Grandmother   . Ovarian cancer Paternal Grandmother     Allergies  Allergen Reactions  . Escitalopram Other (See Comments)    Not able to concentrate  . Vilazodone Other (See Comments)    Viibryd-Headache  . Norelgestromin-Eth Estradiol Rash    Xulane Patch    Current Outpatient Medications on File Prior to Visit  Medication Sig Dispense Refill  . levonorgestrel (KYLEENA) 19.5 MG IUD Kyleena 17.5 mcg/24 hrs (30yrs) 19.5mg  intrauterine device  Take 1 device by intrauterine route.     No current facility-administered medications on file prior to visit.    BP 100/60   Pulse 83   Temp 98.3 F (36.8 C) (Temporal)    Ht 5\' 5"  (1.651 m)   Wt 112 lb (50.8 kg)   SpO2 99%   BMI 18.64 kg/m  Objective:   Physical Exam Cardiovascular:     Rate and Rhythm: Normal rate and regular rhythm.  Pulmonary:     Effort: Pulmonary effort is normal.     Breath sounds: Normal breath sounds.  Abdominal:     Tenderness: There is abdominal tenderness in the suprapubic area. There is no right CVA tenderness or left CVA tenderness.  Skin:    General: Skin is warm and dry.  Neurological:     Mental Status: She is alert.           Assessment & Plan:      This visit occurred during the SARS-CoV-2 public health emergency.  Safety protocols were in place, including screening questions prior to the visit, additional usage of staff PPE, and extensive cleaning of exam room while observing appropriate contact time as indicated for disinfecting solutions.

## 2020-09-13 NOTE — Patient Instructions (Signed)
Stop by the lab prior to leaving today. I will notify you of your results once received.   Try calling [facility] for evaluation and treatment of ADHD.   Call your Urologist as discussed.  It was a pleasure to see you today!

## 2020-09-13 NOTE — Assessment & Plan Note (Signed)
Chronic for the last year, no prior diagnosis. Discussed that formal testing is required. She will call [facility] in [place], will also place referral.  Would be reticent to treat with stimulants given her reports of tremor, difficulty with weight gain, etc.

## 2020-09-14 LAB — COMPREHENSIVE METABOLIC PANEL
ALT: 9 U/L (ref 0–35)
AST: 14 U/L (ref 0–37)
Albumin: 4.6 g/dL (ref 3.5–5.2)
Alkaline Phosphatase: 57 U/L (ref 39–117)
BUN: 9 mg/dL (ref 6–23)
CO2: 30 mEq/L (ref 19–32)
Calcium: 9.4 mg/dL (ref 8.4–10.5)
Chloride: 102 mEq/L (ref 96–112)
Creatinine, Ser: 0.74 mg/dL (ref 0.40–1.20)
GFR: 114.8 mL/min (ref >60.00)
Glucose, Bld: 98 mg/dL (ref 70–99)
Potassium: 4.1 mEq/L (ref 3.5–5.1)
Sodium: 140 mEq/L (ref 135–145)
Total Bilirubin: 0.7 mg/dL (ref 0.2–1.2)
Total Protein: 7 g/dL (ref 6.0–8.3)

## 2020-09-14 LAB — TSH: TSH: 1.47 u[IU]/mL (ref 0.35–4.50)

## 2020-12-12 ENCOUNTER — Ambulatory Visit (INDEPENDENT_AMBULATORY_CARE_PROVIDER_SITE_OTHER): Payer: 59 | Admitting: Podiatrist

## 2020-12-12 ENCOUNTER — Other Ambulatory Visit: Payer: Self-pay

## 2020-12-12 ENCOUNTER — Encounter: Payer: Self-pay | Admitting: Podiatrist

## 2020-12-12 DIAGNOSIS — S91209A Unspecified open wound of unspecified toe(s) with damage to nail, initial encounter: Secondary | ICD-10-CM

## 2020-12-12 NOTE — Progress Notes (Signed)
Chief Complaint  Patient presents with   Nail Problem    Left foot 3rd toe nail PT stated that she ripped the nail off a week ago from today  She is concerned about if the nail will grow back or not  She stated that there has been some itching      HPI: Patient is 25 y.o. female who presents today for the concerns as listed above.  She states she was in [place] about a week ago and she was descending some rocky steps and on the last step it was farther down than she expected and she scuffed her foot on the step and her toenail came off.  She had a photo and the entire nail came off in one piece. She was able to get the sand out of the nail bed and has been keeping it clean.  She states the toe is a little swollen as well.   Patient Active Problem List   Diagnosis Date Noted   Pelvic pressure in female 09/13/2020   Difficulty concentrating 09/13/2020   Genital herpes simplex 04/14/2019   Moderate episode of recurrent major depressive disorder (HCC) 01/30/2019   Weight loss 01/30/2019   Traveler's diarrhea 10/17/2017   Bronchitis 07/16/2017   GAD (generalized anxiety disorder) 05/28/2016   Migraines 05/28/2016    Current Outpatient Medications on File Prior to Visit  Medication Sig Dispense Refill   metroNIDAZOLE (METROGEL) 0.75 % vaginal gel Apply to vagina nightly for 5 nights.     levonorgestrel (KYLEENA) 19.5 MG IUD Kyleena 17.5 mcg/24 hrs (37yrs) 19.5mg  intrauterine device  Take 1 device by intrauterine route.     metroNIDAZOLE (METROGEL) 0.75 % vaginal gel Place vaginally at bedtime.     No current facility-administered medications on file prior to visit.    Allergies  Allergen Reactions   Escitalopram Other (See Comments)    Not able to concentrate   Vilazodone Other (See Comments)    Viibryd-Headache   Norelgestromin-Eth Estradiol Rash    Xulane Patch    Review of Systems No fevers, chills, nausea, muscle aches, no difficulty breathing, no calf pain, no chest pain or  shortness of breath.   Physical Exam  GENERAL APPEARANCE: Alert, conversant. Appropriately groomed. No acute distress.   VASCULAR: Pedal pulses palpable DP and PT bilateral.  Capillary refill time is immediate to all digits,  Proximal to distal cooling it warm to warm.  Digital perfusion adequate.   NEUROLOGIC: sensation is intact to 5.07 monofilament at 5/5 sites bilateral.  Light touch is intact bilateral, vibratory sensation intact bilateral  MUSCULOSKELETAL: acceptable muscle strength, tone and stability bilateral.  No gross boney pedal deformities noted.  No pain, crepitus or limitation noted with foot and ankle range of motion bilateral.   DERMATOLOGIC: skin is warm, supple, and dry.  No open lesions noted.  No rash, no pre ulcerative lesions. Left third toe has a fully avulsed toenail.  A small scab is present where the nail should be. No redness, no pus, no drainage noted.  Slight amount of swelling of the toe is noted but not significant.  No sign of infection present.     Assessment     ICD-10-CM   1. Avulsion of toenail, initial encounter  S91.209A        Plan  Discussed exam findings with the patient and discussed that her toenail should grow back.  Judging from the picture of the nail it does appear the entire nail came off at the root  but the root stayed intact.  I recommended keeping to toenail bed clean and to apply vaseline to the area as the new nail grows out over the next several months. I also showed her how to wrap the toe in coban should this be more comfortable for her.  She will be seen back if any problems arise. I did tell her there could be a ridge or the toenail might grow out funny due to the injury.  She demonstrated an understanding of this and will call if any questions arise.

## 2021-09-14 ENCOUNTER — Telehealth: Payer: Self-pay

## 2021-09-14 NOTE — Telephone Encounter (Signed)
Patient last seen in our office 09/14/2019. We need to set up CPE and review Health maintenance. If seen by another PPCP need to remove Ebony White.

## 2021-09-15 ENCOUNTER — Ambulatory Visit: Payer: 59 | Admitting: Primary Care

## 2021-09-15 NOTE — Telephone Encounter (Signed)
Pt is scheduled for 09/22/21 for CPE

## 2021-09-22 ENCOUNTER — Ambulatory Visit: Payer: 59 | Admitting: Primary Care

## 2021-09-22 VITALS — BP 102/64 | HR 78 | Temp 98.7°F | Ht 65.0 in | Wt 121.0 lb

## 2021-09-22 DIAGNOSIS — A6000 Herpesviral infection of urogenital system, unspecified: Secondary | ICD-10-CM | POA: Diagnosis not present

## 2021-09-22 DIAGNOSIS — F331 Major depressive disorder, recurrent, moderate: Secondary | ICD-10-CM

## 2021-09-22 DIAGNOSIS — F411 Generalized anxiety disorder: Secondary | ICD-10-CM

## 2021-09-22 DIAGNOSIS — G252 Other specified forms of tremor: Secondary | ICD-10-CM | POA: Diagnosis not present

## 2021-09-22 DIAGNOSIS — Z23 Encounter for immunization: Secondary | ICD-10-CM | POA: Diagnosis not present

## 2021-09-22 DIAGNOSIS — Z0001 Encounter for general adult medical examination with abnormal findings: Secondary | ICD-10-CM

## 2021-09-22 DIAGNOSIS — F909 Attention-deficit hyperactivity disorder, unspecified type: Secondary | ICD-10-CM

## 2021-09-22 DIAGNOSIS — L659 Nonscarring hair loss, unspecified: Secondary | ICD-10-CM | POA: Diagnosis not present

## 2021-09-22 DIAGNOSIS — G43909 Migraine, unspecified, not intractable, without status migrainosus: Secondary | ICD-10-CM | POA: Diagnosis not present

## 2021-09-22 DIAGNOSIS — Z1159 Encounter for screening for other viral diseases: Secondary | ICD-10-CM

## 2021-09-22 LAB — COMPREHENSIVE METABOLIC PANEL
ALT: 11 U/L (ref 0–35)
AST: 15 U/L (ref 0–37)
Albumin: 4.7 g/dL (ref 3.5–5.2)
Alkaline Phosphatase: 45 U/L (ref 39–117)
BUN: 12 mg/dL (ref 6–23)
CO2: 30 mEq/L (ref 19–32)
Calcium: 9.7 mg/dL (ref 8.4–10.5)
Chloride: 101 mEq/L (ref 96–112)
Creatinine, Ser: 0.8 mg/dL (ref 0.40–1.20)
GFR: 103.8 mL/min (ref >60.00)
Glucose, Bld: 86 mg/dL (ref 70–99)
Potassium: 4.2 mEq/L (ref 3.5–5.1)
Sodium: 140 mEq/L (ref 135–145)
Total Bilirubin: 0.8 mg/dL (ref 0.2–1.2)
Total Protein: 7.4 g/dL (ref 6.0–8.3)

## 2021-09-22 LAB — LIPID PANEL
Cholesterol: 115 mg/dL (ref 0–200)
HDL: 61.9 mg/dL (ref >39.00)
LDL Cholesterol: 46 mg/dL (ref 0–99)
NonHDL: 52.98
Total CHOL/HDL Ratio: 2
Triglycerides: 35 mg/dL (ref 0.0–149.0)
VLDL: 7 mg/dL (ref 0.0–40.0)

## 2021-09-22 LAB — CBC
HCT: 41.4 % (ref 36.0–46.0)
Hemoglobin: 14 g/dL (ref 12.0–15.0)
MCHC: 33.8 g/dL (ref 30.0–36.0)
MCV: 83 fl (ref 78.0–100.0)
Platelets: 245 10*3/uL (ref 150.0–400.0)
RBC: 4.98 Mil/uL (ref 3.87–5.11)
RDW: 13.1 % (ref 11.5–15.5)
WBC: 7.1 10*3/uL (ref 4.0–10.5)

## 2021-09-22 LAB — TSH: TSH: 1 u[IU]/mL (ref 0.35–5.50)

## 2021-09-22 LAB — HIV ANTIBODY (ROUTINE TESTING W REFLEX): HIV 1&2 Ab, 4th Generation: NEGATIVE

## 2021-09-22 LAB — VITAMIN B12: Vitamin B-12: 198 pg/mL — ABNORMAL LOW (ref 211–911)

## 2021-09-22 LAB — HM PAP SMEAR

## 2021-09-22 NOTE — Assessment & Plan Note (Signed)
Resolved. No concerns today. 

## 2021-09-22 NOTE — Progress Notes (Signed)
Subjective:    Patient ID: Ebony White, female    DOB: July 23, 1995, 26 y.o.   MRN: [id number]  HPI  Ebony White is a very pleasant 26 y.o. female who presents today for complete physical and follow up of chronic conditions.  She was referred to [place] today by her psychiatrist for symptoms of hair thinning, resting tremors to hands, occasional palpitations. She began taking methylphenidate in Fall of 2022, symptoms started around then except for tremors which began prior to taking her methylphenidate. She has a family history of hypothyroidism.    She's noticed an increase in anxiety since she started methylphenidate. Previously managed on Lexapro which caused her to "feel like a zombie". She was once seeing a therapist, is interested to return. She did not take her methylphenidate today and her anxiety has improved.   Immunizations: -Tetanus: 2011, due today -Influenza: Did not complete last season  -Covid-19: 3 vaccines   Diet: Fair diet.  Exercise: No regular exercise. Active  Eye exam: Completes annually  Dental exam: Completes semi-annually   Pap Smear: Completed per GYN.   BP Readings from Last 3 Encounters:  09/22/21 102/64  09/13/20 100/60  08/03/20 90/60       Review of Systems  Constitutional:  Negative for unexpected weight change.  HENT:  Negative for rhinorrhea.   Respiratory:  Negative for cough and shortness of breath.   Cardiovascular:  Negative for chest pain.  Gastrointestinal:  Negative for constipation and diarrhea.  Genitourinary:  Negative for difficulty urinating.  Musculoskeletal:  Negative for arthralgias and myalgias.  Skin:  Negative for rash.  Allergic/Immunologic: Negative for environmental allergies.  Neurological:  Positive for tremors. Negative for headaches.  Psychiatric/Behavioral:  The patient is nervous/anxious.        See HPI        Past Medical History:  Diagnosis Date   GAD (generalized anxiety disorder)    Migraines      Social History   Socioeconomic History   Marital status: Single    Spouse name: Not on file   Number of children: Not on file   Years of education: Not on file   Highest education level: Not on file  Occupational History   Not on file  Tobacco Use   Smoking status: Never   Smokeless tobacco: Never  Substance and Sexual Activity   Alcohol use: No   Drug use: No   Sexual activity: Never  Other Topics Concern   Not on file  Social History Narrative   Single.    No children.   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   Works in [employer] and at [employer].   Social Determinants of Health   Financial Resource Strain: Not on file  Food Insecurity: Not on file  Transportation Needs: Not on file  Physical Activity: Not on file  Stress: Not on file  Social Connections: Not on file  Intimate Partner Violence: Not on file    Past Surgical History:  Procedure Laterality Date   APPENDECTOMY  2012    Family History  Problem Relation Age of Onset   Anxiety disorder Mother    Migraines Mother    Hypertension Mother    Diabetes Father    Anxiety disorder Maternal Grandmother    Hypertension Maternal Grandfather    Lung cancer Maternal Grandfather    Diabetes Paternal Grandmother    Ovarian cancer Paternal Grandmother  Allergies  Allergen Reactions   Escitalopram Other (See Comments)    Not able to concentrate   Vilazodone Other (See Comments)    Viibryd-Headache   Norelgestromin-Eth Estradiol Rash    Xulane Patch    Current Outpatient Medications on File Prior to Visit  Medication Sig Dispense Refill   levonorgestrel (KYLEENA) 19.5 MG IUD Kyleena 17.5 mcg/24 hrs (110yrs) 19.5mg  intrauterine device  Take 1 device by intrauterine route.     methylphenidate (METADATE CD) 50 MG CR capsule methylphenidate CD 50 mg biphasic 30-70 capsule,extended release  TAKE 1 CAPSULE BY MOUTH EVERY DAY     No current  facility-administered medications on file prior to visit.    BP 102/64   Pulse 78   Temp 98.7 F (37.1 C) (Oral)   Ht 5\' 5"  (1.651 m)   Wt 121 lb (54.9 kg)   SpO2 99%   BMI 20.14 kg/m  Objective:   Physical Exam HENT:     Right Ear: Tympanic membrane and ear canal normal.     Left Ear: Tympanic membrane and ear canal normal.     Nose: Nose normal.  Eyes:     Conjunctiva/sclera: Conjunctivae normal.     Pupils: Pupils are equal, round, and reactive to light.  Neck:     Thyroid: No thyromegaly.  Cardiovascular:     Rate and Rhythm: Normal rate and regular rhythm.     Heart sounds: No murmur heard. Pulmonary:     Effort: Pulmonary effort is normal.     Breath sounds: Normal breath sounds. No rales.  Abdominal:     General: Bowel sounds are normal.     Palpations: Abdomen is soft.     Tenderness: There is no abdominal tenderness.  Musculoskeletal:        General: Normal range of motion.     Cervical back: Neck supple.  Lymphadenopathy:     Cervical: No cervical adenopathy.  Skin:    General: Skin is warm and dry.     Findings: No rash.  Neurological:     Mental Status: She is alert and oriented to person, place, and time.     Cranial Nerves: No cranial nerve deficit.     Deep Tendon Reflexes: Reflexes are normal and symmetric.     Comments: Resting tremor noted to bilateral hands  Psychiatric:        Mood and Affect: Mood normal.          Assessment & Plan:

## 2021-09-22 NOTE — Assessment & Plan Note (Signed)
Increased since initiation of methylphenidate.   Referral placed for therapy.

## 2021-09-22 NOTE — Patient Instructions (Signed)
Stop by the lab prior to leaving today. I will notify you of your results once received.   You will be contacted regarding your referral to therapy.  Please let us know if you have not been contacted within two weeks.   Your Typhoid vaccine is due in April of 2024.  It was a pleasure to see you today!  Preventive Care 3-24 Years Old, Female Preventive care refers to lifestyle choices and visits with your health care provider that can promote health and wellness. Preventive care visits are also called wellness exams. What can I expect for my preventive care visit? Counseling During your preventive care visit, your health care provider may ask about your: Medical history, including: Past medical problems. Family medical history. Pregnancy history. Current health, including: Menstrual cycle. Method of birth control. Emotional well-being. Home life and relationship well-being. Sexual activity and sexual health. Lifestyle, including: Alcohol, nicotine or tobacco, and drug use. Access to firearms. Diet, exercise, and sleep habits. Work and work Statistician. Sunscreen use. Safety issues such as seatbelt and bike helmet use. Physical exam Your health care provider may check your: Height and weight. These may be used to calculate your BMI (body mass index). BMI is a measurement that tells if you are at a healthy weight. Waist circumference. This measures the distance around your waistline. This measurement also tells if you are at a healthy weight and may help predict your risk of certain diseases, such as type 2 diabetes and high blood pressure. Heart rate and blood pressure. Body temperature. Skin for abnormal spots. What immunizations do I need?  Vaccines are usually given at various ages, according to a schedule. Your health care provider will recommend vaccines for you based on your age, medical history, and lifestyle or other factors, such as travel or where you work. What tests  do I need? Screening Your health care provider may recommend screening tests for certain conditions. This may include: Pelvic exam and Pap test. Lipid and cholesterol levels. Diabetes screening. This is done by checking your blood sugar (glucose) after you have not eaten for a while (fasting). Hepatitis B test. Hepatitis C test. HIV (human immunodeficiency virus) test. STI (sexually transmitted infection) testing, if you are at risk. BRCA-related cancer screening. This may be done if you have a family history of breast, ovarian, tubal, or peritoneal cancers. Talk with your health care provider about your test results, treatment options, and if necessary, the need for more tests. Follow these instructions at home: Eating and drinking  Eat a healthy diet that includes fresh fruits and vegetables, whole grains, lean protein, and low-fat dairy products. Take vitamin and mineral supplements as recommended by your health care provider. Do not drink alcohol if: Your health care provider tells you not to drink. You are pregnant, may be pregnant, or are planning to become pregnant. If you drink alcohol: Limit how much you have to 0-1 drink a day. Know how much alcohol is in your drink. In the [place] one drink equals one 12 oz bottle of beer (355 mL), one 5 oz glass of wine (148 mL), or one 1 oz glass of hard liquor (44 mL). Lifestyle Brush your teeth every morning and night with fluoride toothpaste. Floss one time each day. Exercise for at least 30 minutes 5 or more days each week. Do not use any products that contain nicotine or tobacco. These products include cigarettes, chewing tobacco, and vaping devices, such as e-cigarettes. If you need help quitting, ask your health  care provider. Do not use drugs. If you are sexually active, practice safe sex. Use a condom or other form of protection to prevent STIs. If you do not wish to become pregnant, use a form of birth control. If you plan to become  pregnant, see your health care provider for a prepregnancy visit. Find healthy ways to manage stress, such as: Meditation, yoga, or listening to music. Journaling. Talking to a trusted person. Spending time with friends and family. Minimize exposure to UV radiation to reduce your risk of skin cancer. Safety Always wear your seat belt while driving or riding in a vehicle. Do not drive: If you have been drinking alcohol. Do not ride with someone who has been drinking. If you have been using any mind-altering substances or drugs. While texting. When you are tired or distracted. Wear a helmet and other protective equipment during sports activities. If you have firearms in your house, make sure you follow all gun safety procedures. Seek help if you have been physically or sexually abused. What's next? Go to your health care provider once a year for an annual wellness visit. Ask your health care provider how often you should have your eyes and teeth checked. Stay up to date on all vaccines. This information is not intended to replace advice given to you by your health care provider. Make sure you discuss any questions you have with your health care provider. Document Revised: 10/05/2020 Document Reviewed: 10/05/2020 Elsevier Patient Education  [employer].

## 2021-09-22 NOTE — Assessment & Plan Note (Signed)
Overall stable.   Referral placed for therapy for anxiety.

## 2021-09-22 NOTE — Assessment & Plan Note (Signed)
Tetanus due, other vaccines UTD.  Pap smear UTD. Follows with GYN.  Discussed the importance of a healthy diet and regular exercise in order for weight loss, and to reduce the risk of further co-morbidity.  Exam as noted. Labs pending.  Follow up in 1 year for repeat physical.

## 2021-09-22 NOTE — Assessment & Plan Note (Addendum)
Following with [facility].  Continue methylphenidate CD 50 mg daily.  Do suspect some of her symptoms today are side effects from methylphenidate. Discussed this with patient today.  Will check labs to rule out a metabolic cause.

## 2021-09-22 NOTE — Assessment & Plan Note (Signed)
Noted on exam today to hands.  Suspect this could be benign vs side effect/exacerbation from methylphenidate. Checking labs to rule out other cause.

## 2021-09-22 NOTE — Assessment & Plan Note (Signed)
No recent outbreaks.  Continue to monitor.   Continue Valtrex PRN.

## 2021-09-25 LAB — HEPATITIS C ANTIBODY
Hepatitis C Ab: NONREACTIVE
SIGNAL TO CUT-OFF: 0.07 (ref <1.00)

## 2021-09-29 ENCOUNTER — Encounter: Payer: Self-pay | Admitting: Primary Care

## 2021-10-17 ENCOUNTER — Ambulatory Visit: Payer: 59 | Admitting: Primary Care

## 2021-10-19 NOTE — Addendum Note (Signed)
Addended by: Donnamarie Poag on: 10/19/2021 04:08 PM   Modules accepted: Orders

## 2022-02-07 ENCOUNTER — Ambulatory Visit
Admission: EM | Admit: 2022-02-07 | Discharge: 2022-02-07 | Disposition: A | Discharge status: Home / Self Care | Payer: 59

## 2022-02-07 DIAGNOSIS — R112 Nausea with vomiting, unspecified: Secondary | ICD-10-CM

## 2022-02-07 DIAGNOSIS — J019 Acute sinusitis, unspecified: Secondary | ICD-10-CM | POA: Diagnosis not present

## 2022-02-07 MED ORDER — PROMETHAZINE HCL 25 MG PO TABS: 25.0000 mg | ORAL_TABLET | Freq: Four times a day (QID) | ORAL | 0 refills | Status: DC | PRN

## 2022-02-07 MED ORDER — PSEUDOEPHEDRINE HCL ER 120 MG PO TB12: 120.0000 mg | ORAL_TABLET | Freq: Two times a day (BID) | ORAL | 0 refills | Status: AC

## 2022-02-07 MED ORDER — PREDNISONE 10 MG (21) PO TBPK: ORAL_TABLET | Freq: Every day | ORAL | 0 refills | Status: DC

## 2022-02-07 MED ORDER — DEXAMETHASONE SODIUM PHOSPHATE 10 MG/ML IJ SOLN
10.0000 mg | Freq: Once | INTRAMUSCULAR | Status: AC
  Administered 2022-02-07: 10 mg via INTRAMUSCULAR

## 2022-02-07 MED ORDER — ONDANSETRON 8 MG PO TBDP
8.0000 mg | ORAL_TABLET | Freq: Once | ORAL | Status: AC
  Administered 2022-02-07: 8 mg via ORAL

## 2022-02-07 NOTE — ED Triage Notes (Signed)
Pt. Presents to UC w/ c/o nasal drainage, post-nasal drip and sinus pressure that started yesterday. Pt. States today she a had 7 emesis episodes as well.

## 2022-02-07 NOTE — ED Provider Notes (Addendum)
[facility]    CSN: [id number] Arrival date & time: 02/07/22  1624      History   Chief Complaint Chief Complaint  Patient presents with   Nasal Congestion   Facial Pain   Emesis    HPI AMOR HYLE is a 26 y.o. female.    Emesis   Presents to UC with complaint of nasal congestion, drainage, postnasal drip, severe sinus pain and pressure.  Symptoms starting yesterday.  She states that symptoms started mid afternoon and prevented her from going to class.  Attempted going to work this morning but started having episodes of vomiting while sitting in her car.  Most recent episode of vomiting prior to entering clinic today.  She also endorses puffy eyes and clear drainage yesterday.  Awoke today with both eyes sealed shut and sticky exudate present.  Has had to wipe away exudate several times during the day.  Past Medical History:  Diagnosis Date   GAD (generalized anxiety disorder)    Migraines     Patient Active Problem List   Diagnosis Date Noted   Encounter for annual general medical examination with abnormal findings in adult 09/22/2021   Resting tremor 09/22/2021   Pelvic pressure in female 09/13/2020   ADHD 09/13/2020   Genital herpes simplex 04/14/2019   Moderate episode of recurrent major depressive disorder ([place]) 01/30/2019   Weight loss 01/30/2019   GAD (generalized anxiety disorder) 05/28/2016   Migraines 05/28/2016    Past Surgical History:  Procedure Laterality Date   APPENDECTOMY  2012    OB History     Gravida  1   Para      Term      Preterm      AB      Living         SAB      IAB      Ectopic      Multiple      Live Births               Home Medications    Prior to Admission medications   Medication Sig Start Date End Date Taking? Authorizing Provider  desonide (DESOWEN) 0.05 % cream APPLY TOPICALLY 2 (TWO) TIMES A DAY FOR 7 DAYS. APPLY TO FACE AS DIRECTED. AVOID EYE AREA.    [provider]   doxycycline (VIBRAMYCIN) 100 MG capsule Take 100 mg by mouth daily. 01/30/22   [provider]  ketotifen (ZADITOR) 0.025 % ophthalmic solution INSTILL 1 DROP INTO BOTH EYES IN THE MORNING AND IN THE EVENING    [provider]  levonorgestrel (KYLEENA) 19.5 MG IUD Kyleena 17.5 mcg/24 hrs (38yrs) 19.5mg  intrauterine device  Take 1 device by intrauterine route. 05/26/19   [provider]  methylphenidate (METADATE CD) 50 MG CR capsule methylphenidate CD 50 mg biphasic 30-70 capsule,extended release  TAKE 1 CAPSULE BY MOUTH EVERY DAY    [provider]  methylphenidate (METADATE CD) 50 MG CR capsule Take 1 tablet by mouth daily.    [provider]  metroNIDAZOLE (METROGEL) 0.75 % vaginal gel INSERT 1 APPLICATORFUL VAGINALLY EVERY DAY FOR 5 DAYS    [provider]  valACYclovir (VALTREX) 500 MG tablet Take 1 tablet every day by oral route.    [provider]  viloxazine ER (QELBREE) 100 MG 24 hr capsule Take 1 capsule every day by oral route.    [provider]    Family History Family History  Problem Relation  Age of Onset   Anxiety disorder Mother    Migraines Mother    Hypertension Mother    Diabetes Father    Anxiety disorder Maternal Grandmother    Hypertension Maternal Grandfather    Lung cancer Maternal Grandfather    Diabetes Paternal Grandmother    Ovarian cancer Paternal Grandmother     Social History Social History   Tobacco Use   Smoking status: Never   Smokeless tobacco: Never  Substance Use Topics   Alcohol use: No   Drug use: No     Allergies   Escitalopram, Vilazodone, and Norelgestromin-eth estradiol   Review of Systems Review of Systems  Gastrointestinal:  Positive for vomiting.     Physical Exam Triage Vital Signs ED Triage Vitals  Enc Vitals Group     BP 02/07/22 1636 109/73     Pulse Rate 02/07/22 1636 87     Resp 02/07/22 1636 16     Temp 02/07/22 1636 98.7 F (37.1 C)      Temp Source 02/07/22 1636 Oral     SpO2 02/07/22 1636 98 %     Weight --      Height --      Head Circumference --      Peak Flow --      Pain Score 02/07/22 1635 6     Pain Loc --      Pain Edu? --      Excl. in [place]? --    No data found.  Updated Vital Signs BP 109/73 (BP Location: Left Arm)   Pulse 87   Temp 98.7 F (37.1 C) (Oral)   Resp 16   LMP  (LMP Unknown)   SpO2 98%   Breastfeeding No   Visual Acuity Right Eye Distance:   Left Eye Distance:   Bilateral Distance:    Right Eye Near:   Left Eye Near:    Bilateral Near:     Physical Exam Vitals reviewed.  Constitutional:      Appearance: Normal appearance. She is ill-appearing.  Skin:    General: Skin is warm and dry.  Neurological:     General: No focal deficit present.     Mental Status: She is alert and oriented to person, place, and time.  Psychiatric:        Mood and Affect: Mood normal.        Behavior: Behavior normal.      UC Treatments / Results  Labs (all labs ordered are listed, but only abnormal results are displayed) Labs Reviewed - No data to display  EKG   Radiology No results found.  Procedures Procedures (including critical care time)  Medications Ordered in UC Medications  ondansetron (ZOFRAN-ODT) disintegrating tablet 8 mg (has no administration in time range)    Initial Impression / Assessment and Plan / UC Course  I have reviewed the triage vital signs and the nursing notes.  Pertinent labs & imaging results that were available during my care of the patient were reviewed by me and considered in my medical decision making (see chart for details).   Allergic versus viral rhinosinusitis, more likely viral given other symptoms and no history of allergic rhinitis.  Will treat severe sinus symptoms with course of steroids.  Will attempt to relieve nausea and vomiting with zofran in clinic. She states some improvement but continue with severe nausea. Will give decadron IM in  the office since she is unable to hold down PO medication.  He endorses improved  symptoms about 15 minutes after Decadron injection.   Final Clinical Impressions(s) / UC Diagnoses   Final diagnoses:  Nausea and vomiting, unspecified vomiting type  Acute rhinosinusitis   Discharge Instructions   None    ED Prescriptions   None    PDMP not reviewed this encounter.   Rose Phi, FNP 02/07/22 1701    Rose Phi, [place] 02/07/22 1720

## 2022-02-07 NOTE — Discharge Instructions (Addendum)
Follow up here or with your primary care provider if your symptoms are worsening or not improving with treatment.     

## 2022-02-08 ENCOUNTER — Telehealth: Payer: Self-pay | Admitting: Urgent Care

## 2022-02-08 NOTE — Telephone Encounter (Signed)
Ebony White contacted urgent care to discuss her visit yesterday.  She asked whether she was safe to take the promethazine that had been prescribed for her.  I assured her that she could.  She states she has been holding fluids down and some foods today though she has vomited twice.  She reports her nasal and sinus pressure is improved after taking Sudafed last night.  She also was able to hold down doxycycline prescribed by another provider as well as the prednisone prescribed here.  Lastly she requested updated work and school notes allowing her to miss class and work today.  I redid the notes and printed them.  Asked her to check in her MyChart account to see if she could view them there.  She states if she cannot she will attempt to pick them up in clinic tomorrow.  Notes were left on my colleague's keyboard.

## 2022-10-16 ENCOUNTER — Encounter: Payer: Self-pay | Admitting: Primary Care

## 2022-10-16 ENCOUNTER — Ambulatory Visit: Payer: 59 | Admitting: Primary Care

## 2022-10-16 VITALS — BP 98/64 | HR 75 | Temp 98.4°F | Ht 65.0 in | Wt 128.0 lb

## 2022-10-16 DIAGNOSIS — H9311 Tinnitus, right ear: Secondary | ICD-10-CM

## 2022-10-16 MED ORDER — FLUTICASONE PROPIONATE 50 MCG/ACT NA SUSP: 1.0000 | Freq: Two times a day (BID) | NASAL | 0 refills | Status: DC

## 2022-10-16 NOTE — Progress Notes (Signed)
Subjective:    Patient ID: Ebony White, female    DOB: [birth year], 27 y.o.   MRN: [id number]  HPI  Ebony White is a very pleasant 27 y.o. female with a history of migraines who presents today to discuss tinnitus.  Approximately 5 to 6 days ago she was outside and felt an insect hit her right exterior ear.  The following day she noticed vibrations to the outside of her ear and a "high-pitched ringing" sound to the inside of the right ear. Two days ago she noticed a "deep pulsing" sensation to her inner right ear.   Her symptoms will stop for a few seconds if she rubs the tragus of her right ear, but they will return shortly.   Last night she began to notice a right temporal headache which has continued today.   She's applied peroxide without improvement. She does not use q-tips regularly.   She denies fevers, left otalgia, post nasal drip, rhinorrhea, cough.    Review of Systems  Constitutional:  Negative for chills and fever.  HENT:  Positive for ear pain and tinnitus. Negative for congestion, postnasal drip, sinus pressure and sore throat.   Respiratory:  Negative for cough.          Past Medical History:  Diagnosis Date   GAD (generalized anxiety disorder)    Migraines     Social History   Socioeconomic History   Marital status: Single    Spouse name: Not on file   Number of children: Not on file   Years of education: Not on file   Highest education level: Not on file  Occupational History   Not on file  Tobacco Use   Smoking status: Never   Smokeless tobacco: Never  Substance and Sexual Activity   Alcohol use: No   Drug use: No   Sexual activity: Never  Other Topics Concern   Not on file  Social History Narrative   Single.    No children.   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   Works in [employer] and at [employer].   Social Determinants of Health   Financial Resource Strain: Not on file  Food  Insecurity: Not on file  Transportation Needs: Not on file  Physical Activity: Not on file  Stress: Not on file  Social Connections: Not on file  Intimate Partner Violence: Not on file    Past Surgical History:  Procedure Laterality Date   APPENDECTOMY  2012    Family History  Problem Relation Age of Onset   Anxiety disorder Mother    Migraines Mother    Hypertension Mother    Diabetes Father    Anxiety disorder Maternal Grandmother    Hypertension Maternal Grandfather    Lung cancer Maternal Grandfather    Diabetes Paternal Grandmother    Ovarian cancer Paternal Grandmother     Allergies  Allergen Reactions   Escitalopram Other (See Comments)    Not able to concentrate   Vilazodone Other (See Comments)    Viibryd-Headache   Norelgestromin-Eth Estradiol Rash    Xulane Patch    Current Outpatient Medications on File Prior to Visit  Medication Sig Dispense Refill   desonide (DESOWEN) 0.05 % cream APPLY TOPICALLY 2 (TWO) TIMES A DAY FOR 7 DAYS. APPLY TO FACE AS DIRECTED. AVOID EYE AREA.     levonorgestrel (KYLEENA) 19.5 MG IUD Kyleena 17.5 mcg/24 hrs (38yrs) 19.5mg  intrauterine device  Take  1 device by intrauterine route.     valACYclovir (VALTREX) 500 MG tablet Take 1 tablet every day by oral route.     viloxazine ER (QELBREE) 100 MG 24 hr capsule Take 1 capsule every day by oral route.     methylphenidate (METADATE CD) 50 MG CR capsule methylphenidate CD 50 mg biphasic 30-70 capsule,extended release  TAKE 1 CAPSULE BY MOUTH EVERY DAY (Patient not taking: Reported on 10/16/2022)     methylphenidate (METADATE CD) 50 MG CR capsule Take 1 tablet by mouth daily. (Patient not taking: Reported on 10/16/2022)     metroNIDAZOLE (METROGEL) 0.75 % vaginal gel INSERT 1 APPLICATORFUL VAGINALLY EVERY DAY FOR 5 DAYS (Patient not taking: Reported on 10/16/2022)     promethazine (PHENERGAN) 25 MG tablet Take 1 tablet (25 mg total) by mouth every 6 (six) hours as needed for up to 3 days for  nausea or vomiting. 12 tablet 0   No current facility-administered medications on file prior to visit.    BP 98/64   Pulse 75   Temp 98.4 F (36.9 C) (Temporal)   Ht 5\' 5"  (1.651 m)   Wt 128 lb (58.1 kg)   SpO2 98%   BMI 21.30 kg/m  Objective:   Physical Exam Constitutional:      General: She is not in acute distress.    Appearance: She is not ill-appearing.  HENT:     Right Ear: Tympanic membrane and ear canal normal. No middle ear effusion. There is no impacted cerumen. No foreign body. Tympanic membrane is not erythematous, retracted or bulging.     Left Ear: Tympanic membrane and ear canal normal.  No middle ear effusion. There is no impacted cerumen. No foreign body. Tympanic membrane is not erythematous, retracted or bulging.  Cardiovascular:     Rate and Rhythm: Normal rate and regular rhythm.  Pulmonary:     Effort: Pulmonary effort is normal.           Assessment & Plan:  Tinnitus of right ear Assessment & Plan: Unclear etiology given benign exam.  Could be effusion for which was not noted on exam.  Start Flonase (fluticasone) nasal spray. Instill 1 spray in each nostril twice daily.  Update in 1 week.  Consider prednisone if no improvement. Consider ENT referral.  Orders: -     Fluticasone Propionate; Place 1 spray into both nostrils 2 (two) times daily.  Dispense: 16 g; Refill: 0        Doreene Nest, NP

## 2022-10-16 NOTE — Patient Instructions (Signed)
Try using Flonase (fluticasone) nasal spray. Instill 1 spray in each nostril twice daily.   Please update me in 1 week.  It was a pleasure to see you today!

## 2022-10-16 NOTE — Assessment & Plan Note (Signed)
Unclear etiology given benign exam.  Could be effusion for which was not noted on exam.  Start Flonase (fluticasone) nasal spray. Instill 1 spray in each nostril twice daily.  Update in 1 week.  Consider prednisone if no improvement. Consider ENT referral.

## 2022-11-03 ENCOUNTER — Other Ambulatory Visit: Payer: Self-pay | Admitting: Primary Care

## 2022-11-03 DIAGNOSIS — H9311 Tinnitus, right ear: Secondary | ICD-10-CM

## 2023-03-27 ENCOUNTER — Telehealth: Payer: Self-pay

## 2023-03-27 ENCOUNTER — Encounter: Payer: Self-pay | Admitting: Internal Medicine

## 2023-03-27 ENCOUNTER — Ambulatory Visit: Payer: 59 | Admitting: Internal Medicine

## 2023-03-27 VITALS — BP 94/60 | HR 59 | Temp 98.5°F | Ht 65.0 in | Wt 137.0 lb

## 2023-03-27 DIAGNOSIS — R200 Anesthesia of skin: Secondary | ICD-10-CM | POA: Diagnosis not present

## 2023-03-27 DIAGNOSIS — R202 Paresthesia of skin: Secondary | ICD-10-CM | POA: Diagnosis not present

## 2023-03-27 DIAGNOSIS — R238 Other skin changes: Secondary | ICD-10-CM | POA: Diagnosis not present

## 2023-03-27 HISTORY — DX: Other skin changes: R23.8

## 2023-03-27 LAB — HEPATIC FUNCTION PANEL
ALT: 18 U/L (ref 0–35)
AST: 19 U/L (ref 0–37)
Albumin: 4.7 g/dL (ref 3.5–5.2)
Alkaline Phosphatase: 53 U/L (ref 39–117)
Bilirubin, Direct: 0.2 mg/dL (ref 0.0–0.3)
Total Bilirubin: 0.9 mg/dL (ref 0.2–1.2)
Total Protein: 7.5 g/dL (ref 6.0–8.3)

## 2023-03-27 LAB — CBC
HCT: 43.6 % (ref 36.0–46.0)
Hemoglobin: 14.3 g/dL (ref 12.0–15.0)
MCHC: 32.9 g/dL (ref 30.0–36.0)
MCV: 85.6 fL (ref 78.0–100.0)
Platelets: 282 10*3/uL (ref 150.0–400.0)
RBC: 5.09 Mil/uL (ref 3.87–5.11)
RDW: 13.4 % (ref 11.5–15.5)
WBC: 6.2 10*3/uL (ref 4.0–10.5)

## 2023-03-27 LAB — RENAL FUNCTION PANEL
Albumin: 4.7 g/dL (ref 3.5–5.2)
BUN: 11 mg/dL (ref 6–23)
CO2: 32 meq/L (ref 19–32)
Calcium: 9.8 mg/dL (ref 8.4–10.5)
Chloride: 104 meq/L (ref 96–112)
Creatinine, Ser: 0.82 mg/dL (ref 0.40–1.20)
GFR: 99.71 mL/min (ref >60.00)
Glucose, Bld: 99 mg/dL (ref 70–99)
Phosphorus: 3.4 mg/dL (ref 2.3–4.6)
Potassium: 4.4 meq/L (ref 3.5–5.1)
Sodium: 141 meq/L (ref 135–145)

## 2023-03-27 LAB — TSH: TSH: 2.08 u[IU]/mL (ref 0.35–5.50)

## 2023-03-27 LAB — VITAMIN B12: Vitamin B-12: 161 pg/mL — ABNORMAL LOW (ref 211–911)

## 2023-03-27 NOTE — Telephone Encounter (Signed)
Okay---I will assess at the visit 

## 2023-03-27 NOTE — Progress Notes (Signed)
Subjective:    Patient ID: Ebony White, female    DOB: October 01, 1995, 27 y.o.   MRN: [id number]  HPI Here due to left hand and arm discoloration  Noticed yellow/orange discoloration in left hand/arm Saw it this morning Put it under heat--and she could see it fading--now just between fingers Numbness on extensor forearm/arm--tingling (like falling asleep) Slight pain in extensor arm above elbow  No new actions, etc yesterday---was birthday and just shopped for small stuff  Also having "heartburn"--quick pinch in upper left chest along with chill feeling on back of left arm ;lasts 5 seconds or so  Current Outpatient Medications on File Prior to Visit  Medication Sig Dispense Refill   levonorgestrel (KYLEENA) 19.5 MG IUD Kyleena 17.5 mcg/24 hrs (21yrs) 19.5mg  intrauterine device  Take 1 device by intrauterine route.     valACYclovir (VALTREX) 500 MG tablet Take 1 tablet every day by oral route.     viloxazine ER (QELBREE) 100 MG 24 hr capsule Take 200 mg by mouth daily.     No current facility-administered medications on file prior to visit.    Allergies  Allergen Reactions   Escitalopram Other (See Comments)    Not able to concentrate   Norelgestromin-Eth Estradiol Rash    Xulane Patch    Past Medical History:  Diagnosis Date   GAD (generalized anxiety disorder)    Migraines     Past Surgical History:  Procedure Laterality Date   APPENDECTOMY  2012    Family History  Problem Relation Age of Onset   Anxiety disorder Mother    Migraines Mother    Hypertension Mother    Diabetes Father    Anxiety disorder Maternal Grandmother    Hypertension Maternal Grandfather    Lung cancer Maternal Grandfather    Diabetes Paternal Grandmother    Ovarian cancer Paternal Grandmother     Social History   Socioeconomic History   Marital status: Single    Spouse name: Not on file   Number of children: Not on file   Years of education: Not on file   Highest education  level: Not on file  Occupational History   Not on file  Tobacco Use   Smoking status: Never   Smokeless tobacco: Never  Substance and Sexual Activity   Alcohol use: No   Drug use: No   Sexual activity: Never  Other Topics Concern   Not on file  Social History Narrative   Single.    No children.   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   Works in [employer] and at [employer].   Social Determinants of Health   Financial Resource Strain: Not on file  Food Insecurity: Not on file  Transportation Needs: Not on file  Physical Activity: Not on file  Stress: Not on file  Social Connections: Not on file  Intimate Partner Violence: Not on file   Review of Systems Eating normally--but not yet this morning No itching No fever Did take B12 in the past--not now though    Objective:   Physical Exam Musculoskeletal:     Comments: Left shoulder and elbow with normal ROM and no tenderness  Skin:    Comments: Orange discoloration between left 2nd/3rd and 4th fingers Some also on flexor side of distal left forearm  Neurological:     Comments: Some change in sensation along lateral left arm/forearm (radial side)  Assessment & Plan:

## 2023-03-27 NOTE — Assessment & Plan Note (Signed)
No clear jaundice but will check labs Some persistent yellow/orange discoloration (and she doesn't really eat food to cause carotenemia which she looked up)

## 2023-03-27 NOTE — Telephone Encounter (Addendum)
Pt said this morning has discoloration of skin. Between lt fingers, palm and forearm; no itching,pain or swelling. No new toiletries, soaps etc. No difficulty in breathing and no swelling in neck throat, mouth or tongue. Pt has not handled anything orange or red.pt scheduled appt with Dr Alphonsus Sias 03/27/23 at 9 AM with UC & ED precautions given and pt voiced understanding.sending note to Dr Alphonsus Sias.

## 2023-03-27 NOTE — Assessment & Plan Note (Signed)
Suggestive of radial nerve damage Sudden this morning and also slight sensation in chest --more sharp but only seconds Will check labs---but observe only

## 2023-03-28 ENCOUNTER — Other Ambulatory Visit: Payer: Self-pay | Admitting: Internal Medicine

## 2023-03-28 DIAGNOSIS — E538 Deficiency of other specified B group vitamins: Secondary | ICD-10-CM

## 2023-04-29 ENCOUNTER — Ambulatory Visit: Payer: 59 | Admitting: Internal Medicine

## 2023-05-06 ENCOUNTER — Ambulatory Visit: Payer: 59 | Admitting: Internal Medicine

## 2023-05-08 ENCOUNTER — Ambulatory Visit: Payer: 59 | Admitting: Internal Medicine

## 2023-05-08 ENCOUNTER — Other Ambulatory Visit: Payer: 59

## 2023-05-08 ENCOUNTER — Encounter: Payer: Self-pay | Admitting: Internal Medicine

## 2023-05-08 DIAGNOSIS — E538 Deficiency of other specified B group vitamins: Secondary | ICD-10-CM | POA: Diagnosis not present

## 2023-05-08 LAB — VITAMIN B12: Vitamin B-12: 219 pg/mL (ref 211–911)

## 2023-05-08 NOTE — Telephone Encounter (Signed)
 Kelli, please schedule patient for B12 injections once monthly x 3 months.  Needs CPE in 3 months.  Rich, thank you for your evaluation.

## 2023-05-09 NOTE — Telephone Encounter (Signed)
Unable to reach patient. Left voicemail to return call to our office.   

## 2023-05-10 NOTE — Telephone Encounter (Signed)
Unable to reach patient. Left voicemail to return call to our office.   

## 2023-05-14 ENCOUNTER — Telehealth: Payer: Self-pay | Admitting: Primary Care

## 2023-05-14 NOTE — Telephone Encounter (Signed)
Patient has been scheduled

## 2023-05-14 NOTE — Telephone Encounter (Unsigned)
Copied from CRM [id number]. Topic: General - Other >> May 14, 2023  2:38 PM Ebony White wrote: Reason for CRM: Patient returning [facility] Call she received to call the office back ,  Harvin Hazel will call patient back

## 2023-05-14 NOTE — Telephone Encounter (Signed)
See phone note

## 2023-05-14 NOTE — Telephone Encounter (Signed)
Please call patient back to schedule patient for B12 injections once monthly x 3 months.  Needs CPE in 3 months.  Thanks

## 2023-05-15 ENCOUNTER — Ambulatory Visit: Payer: 59

## 2023-05-15 DIAGNOSIS — E538 Deficiency of other specified B group vitamins: Secondary | ICD-10-CM

## 2023-05-15 MED ORDER — CYANOCOBALAMIN 1000 MCG/ML IJ SOLN
1000.0000 ug | Freq: Once | INTRAMUSCULAR | Status: AC
  Administered 2023-05-15: 1000 ug via INTRAMUSCULAR

## 2023-05-15 NOTE — Progress Notes (Signed)
Per orders of Mayra Reel, DPN AGNP-C, injection of B-12 given by Leonor Liv in left deltoid. Patient tolerated injection well. Patient will make appointment for 1 month.

## 2023-06-18 ENCOUNTER — Ambulatory Visit (INDEPENDENT_AMBULATORY_CARE_PROVIDER_SITE_OTHER): Payer: 59

## 2023-06-18 DIAGNOSIS — E538 Deficiency of other specified B group vitamins: Secondary | ICD-10-CM

## 2023-06-18 MED ORDER — CYANOCOBALAMIN 1000 MCG/ML IJ SOLN
1000.0000 ug | Freq: Once | INTRAMUSCULAR | Status: AC
  Administered 2023-06-18: 1000 ug via INTRAMUSCULAR

## 2023-06-18 NOTE — Progress Notes (Signed)
 Per orders of Mayra Reel, DPN AGNP-C, injection of B-12 given by Melina Copa in right deltoid. Patient tolerated injection well. Patient will make appointment for 1 month.

## 2023-07-18 ENCOUNTER — Ambulatory Visit (INDEPENDENT_AMBULATORY_CARE_PROVIDER_SITE_OTHER): Payer: 59

## 2023-07-18 DIAGNOSIS — E538 Deficiency of other specified B group vitamins: Secondary | ICD-10-CM

## 2023-07-18 MED ORDER — CYANOCOBALAMIN 1000 MCG/ML IJ SOLN
1000.0000 ug | Freq: Once | INTRAMUSCULAR | Status: AC
  Administered 2023-07-18: 1000 ug via INTRAMUSCULAR

## 2023-07-18 NOTE — Progress Notes (Signed)
 Per orders of Mayra Reel, DPN AGNP-C, injection of vitamin b 12 given by Lewanda Rife in right deltoid. Patient tolerated injection well.

## 2023-08-07 ENCOUNTER — Ambulatory Visit: Payer: 59 | Admitting: Primary Care

## 2023-08-07 ENCOUNTER — Encounter: Payer: Self-pay | Admitting: Primary Care

## 2023-08-07 VITALS — BP 118/64 | HR 88 | Temp 97.8°F | Ht 65.0 in | Wt 135.0 lb

## 2023-08-07 DIAGNOSIS — Z0001 Encounter for general adult medical examination with abnormal findings: Secondary | ICD-10-CM

## 2023-08-07 DIAGNOSIS — Z0000 Encounter for general adult medical examination without abnormal findings: Secondary | ICD-10-CM | POA: Diagnosis not present

## 2023-08-07 DIAGNOSIS — E538 Deficiency of other specified B group vitamins: Secondary | ICD-10-CM | POA: Diagnosis not present

## 2023-08-07 DIAGNOSIS — N644 Mastodynia: Secondary | ICD-10-CM | POA: Diagnosis not present

## 2023-08-07 DIAGNOSIS — A6000 Herpesviral infection of urogenital system, unspecified: Secondary | ICD-10-CM | POA: Diagnosis not present

## 2023-08-07 DIAGNOSIS — F909 Attention-deficit hyperactivity disorder, unspecified type: Secondary | ICD-10-CM

## 2023-08-07 DIAGNOSIS — Z803 Family history of malignant neoplasm of breast: Secondary | ICD-10-CM | POA: Insufficient documentation

## 2023-08-07 DIAGNOSIS — G43009 Migraine without aura, not intractable, without status migrainosus: Secondary | ICD-10-CM

## 2023-08-07 DIAGNOSIS — F411 Generalized anxiety disorder: Secondary | ICD-10-CM

## 2023-08-07 LAB — VITAMIN B12: Vitamin B-12: 269 pg/mL (ref 211–911)

## 2023-08-07 MED ORDER — VALACYCLOVIR HCL 500 MG PO TABS: 500.0000 mg | ORAL_TABLET | Freq: Two times a day (BID) | ORAL | 0 refills | Status: AC

## 2023-08-07 NOTE — Assessment & Plan Note (Signed)
 Following with [facility]. Continue Qelbree 200 mg daily.

## 2023-08-07 NOTE — Assessment & Plan Note (Signed)
Immunizations UTD. Pap smear UTD  Discussed the importance of a healthy diet and regular exercise in order for weight loss, and to reduce the risk of further co-morbidity.  Exam stable. Labs pending.  Follow up in 1 year for repeat physical.  

## 2023-08-07 NOTE — Assessment & Plan Note (Signed)
 Controlled.  No concerns today. Continue to monitor.

## 2023-08-07 NOTE — Progress Notes (Signed)
 Subjective:    Patient ID: Ebony White, female    DOB: [birth year], 28 y.o.   MRN: [id number]  HPI  Ebony White is a very pleasant 28 y.o. female who presents today for complete physical and follow up of chronic conditions.  She would also like to discuss breast pain. Chronic for the last 3 months, located to the bilateral breasts. She has recently found that multiple female family members have been diagnosed with breast cancer within the last 6 months including multiple cousins, an aunt, and her mother. She denies skin texture changes, lumps. She did undergo genetic testing, she tested high risk.  Immunizations: -Tetanus: Completed in 2023  Diet: Fair diet.  Exercise: Regular exercise at work.  Eye exam: Completes annually  Dental exam: Completes semi-annually    Pap Smear: Completed in 2023  BP Readings from Last 3 Encounters:  08/07/23 118/64  03/27/23 94/60  10/16/22 98/64   Wt Readings from Last 3 Encounters:  08/07/23 135 lb (61.2 kg)  03/27/23 137 lb (62.1 kg)  10/16/22 128 lb (58.1 kg)         Review of Systems  Constitutional:  Negative for unexpected weight change.  HENT:  Negative for rhinorrhea.   Respiratory:  Negative for cough and shortness of breath.   Cardiovascular:  Negative for chest pain.  Gastrointestinal:  Negative for constipation and diarrhea.  Genitourinary:  Negative for difficulty urinating.       Breast pain  Musculoskeletal:  Negative for arthralgias and myalgias.  Skin:  Negative for rash.  Allergic/Immunologic: Negative for environmental allergies.  Neurological:  Negative for dizziness, numbness and headaches.  Psychiatric/Behavioral:  The patient is nervous/anxious.          Past Medical History:  Diagnosis Date   Change of skin color 03/27/2023   GAD (generalized anxiety disorder)    Migraines    Pelvic pressure in female 09/13/2020    Social History   Socioeconomic History   Marital status: Single     Spouse name: Not on file   Number of children: Not on file   Years of education: Not on file   Highest education level: Not on file  Occupational History   Not on file  Tobacco Use   Smoking status: Never   Smokeless tobacco: Never  Substance and Sexual Activity   Alcohol use: No   Drug use: No   Sexual activity: Never  Other Topics Concern   Not on file  Social History Narrative   Single.    No children.   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   Works in [employer] and at [employer].   Social Drivers of Corporate investment banker Strain: Not on file  Food Insecurity: Not on file  Transportation Needs: Not on file  Physical Activity: Not on file  Stress: Not on file  Social Connections: Not on file  Intimate Partner Violence: Not on file    Past Surgical History:  Procedure Laterality Date   APPENDECTOMY  2012    Family History  Problem Relation Age of Onset   Anxiety disorder Mother    Migraines Mother    Hypertension Mother    Diabetes Father    Anxiety disorder Maternal Grandmother    Hypertension Maternal Grandfather    Lung cancer Maternal Grandfather    Diabetes Paternal Grandmother    Ovarian cancer Paternal Grandmother     Allergies  Allergen  Reactions   Escitalopram Other (See Comments)    Not able to concentrate   Norelgestromin-Eth Estradiol Rash    Xulane Patch    Current Outpatient Medications on File Prior to Visit  Medication Sig Dispense Refill   levonorgestrel (KYLEENA) 19.5 MG IUD Kyleena 17.5 mcg/24 hrs (31yrs) 19.5mg  intrauterine device  Take 1 device by intrauterine route.     viloxazine ER (QELBREE) 100 MG 24 hr capsule Take 200 mg by mouth daily. (Patient not taking: Reported on 08/07/2023)     No current facility-administered medications on file prior to visit.    BP 118/64   Pulse 88   Temp 97.8 F (36.6 C) (Temporal)   Ht 5\' 5"  (1.651 m)   Wt 135 lb (61.2 kg)   SpO2 98%   BMI  22.47 kg/m  Objective:   Physical Exam HENT:     Right Ear: Tympanic membrane and ear canal normal.     Left Ear: Tympanic membrane and ear canal normal.  Eyes:     Pupils: Pupils are equal, round, and reactive to light.  Cardiovascular:     Rate and Rhythm: Normal rate and regular rhythm.  Pulmonary:     Effort: Pulmonary effort is normal.     Breath sounds: Normal breath sounds.  Chest:       Comments: Densities with tenderness noted to bilateral breasts at marked sites. Abdominal:     General: Bowel sounds are normal.     Palpations: Abdomen is soft.     Tenderness: There is no abdominal tenderness.  Musculoskeletal:        General: Normal range of motion.     Cervical back: Neck supple.  Skin:    General: Skin is warm and dry.  Neurological:     Mental Status: She is alert and oriented to person, place, and time.     Cranial Nerves: No cranial nerve deficit.     Deep Tendon Reflexes:     Reflex Scores:      Patellar reflexes are 2+ on the right side and 2+ on the left side. Psychiatric:        Mood and Affect: Mood normal.           Assessment & Plan:  Encounter for annual general medical examination with abnormal findings in adult Assessment & Plan: Immunizations UTD. Pap smear UTD.  Discussed the importance of a healthy diet and regular exercise in order for weight loss, and to reduce the risk of further co-morbidity.  Exam stable. Labs pending.  Follow up in 1 year for repeat physical.    Attention deficit hyperactivity disorder (ADHD), unspecified ADHD type Assessment & Plan: Following with [facility]. Continue Qelbree 200 mg daily.   Genital herpes simplex, unspecified site Assessment & Plan: No recent outbreaks.  Continue Valtrex 500 mg PRN  Orders: -     valACYclovir HCl; Take 1 tablet (500 mg total) by mouth 2 (two) times daily. For 3 days as needed for outbreaks  Dispense: 12 tablet; Refill: 0  Family history of  breast cancer in mother Assessment & Plan: Multiple female family members diagnosed, including mother. Recently completed genetic testing, high risk.  Diagnostic mammogram and ultrasound ordered and pending.  Orders: -     [place] LIMITED ULTRASOUND INCLUDING AXILLA LEFT BREAST ; Future -     [place] LIMITED ULTRASOUND INCLUDING AXILLA RIGHT BREAST; Future -     MM 3D DIAGNOSTIC MAMMOGRAM BILATERAL BREAST; Future  GAD (generalized anxiety disorder)  Assessment & Plan: Stable.  Continue to monitor.   Migraine without aura and without status migrainosus, not intractable Assessment & Plan: Controlled.  No concerns today. Continue to monitor.   Breast pain -     [place]  LIMITED ULTRASOUND INCLUDING AXILLA LEFT BREAST ; Future -     [place]  LIMITED ULTRASOUND INCLUDING AXILLA RIGHT BREAST; Future -     MM 3D DIAGNOSTIC MAMMOGRAM BILATERAL BREAST; Future  Vitamin B12 deficiency Assessment & Plan: Completed 3 injections. Repeat B12 level pending.  Orders: -     Vitamin B12        Ebony White Ebony Britzy Graul, NP

## 2023-08-07 NOTE — Patient Instructions (Signed)
 Stop by the lab prior to leaving today. I will notify you of your results once received.   You will receive a phone call regarding the mammogram and ultrasounds.  It was a pleasure to see you today!

## 2023-08-07 NOTE — Assessment & Plan Note (Addendum)
No recent outbreaks. Continue Valtrex 500 mg PRN.

## 2023-08-07 NOTE — Assessment & Plan Note (Signed)
 Completed 3 injections. Repeat B12 level pending.

## 2023-08-07 NOTE — Assessment & Plan Note (Signed)
 Stable.       - Continue to monitor

## 2023-08-07 NOTE — Assessment & Plan Note (Signed)
 Multiple female family members diagnosed, including mother. Recently completed genetic testing, high risk.  Diagnostic mammogram and ultrasound ordered and pending.

## 2023-08-22 ENCOUNTER — Other Ambulatory Visit

## 2023-08-22 ENCOUNTER — Ambulatory Visit

## 2023-08-23 ENCOUNTER — Ambulatory Visit
Admission: RE | Admit: 2023-08-23 | Discharge: 2023-08-23 | Disposition: A | Discharge status: Home / Self Care | Source: Ambulatory Visit | Attending: Primary Care | Admitting: Primary Care

## 2023-08-23 ENCOUNTER — Ambulatory Visit
Admission: RE | Admit: 2023-08-23 | Discharge: 2023-08-23 | Disposition: A | Discharge status: Home / Self Care | Source: Ambulatory Visit | Attending: Primary Care

## 2023-08-23 DIAGNOSIS — Z803 Family history of malignant neoplasm of breast: Secondary | ICD-10-CM

## 2023-08-23 DIAGNOSIS — N644 Mastodynia: Secondary | ICD-10-CM | POA: Diagnosis present

## 2023-08-29 ENCOUNTER — Encounter (HOSPITAL_COMMUNITY): Payer: Self-pay

## 2023-09-05 ENCOUNTER — Other Ambulatory Visit

## 2023-09-05 ENCOUNTER — Ambulatory Visit

## 2023-09-12 ENCOUNTER — Ambulatory Visit (INDEPENDENT_AMBULATORY_CARE_PROVIDER_SITE_OTHER)

## 2023-09-12 DIAGNOSIS — E538 Deficiency of other specified B group vitamins: Secondary | ICD-10-CM | POA: Diagnosis not present

## 2023-09-12 MED ORDER — CYANOCOBALAMIN 1000 MCG/ML IJ SOLN
1000.0000 ug | Freq: Once | INTRAMUSCULAR | Status: AC
  Administered 2023-09-12: 1000 ug via INTRAMUSCULAR

## 2023-09-12 NOTE — Progress Notes (Signed)
Per orders of Mayra Reel, DPN AGNP-C, injection of B-12 given by Nanci Pina in left deltoid. Patient tolerated injection well.

## 2023-09-26 ENCOUNTER — Ambulatory Visit (INDEPENDENT_AMBULATORY_CARE_PROVIDER_SITE_OTHER)

## 2023-09-26 DIAGNOSIS — E538 Deficiency of other specified B group vitamins: Secondary | ICD-10-CM | POA: Diagnosis not present

## 2023-09-26 MED ORDER — CYANOCOBALAMIN 1000 MCG/ML IJ SOLN
1000.0000 ug | Freq: Once | INTRAMUSCULAR | Status: AC
  Administered 2023-09-26: 1000 ug via INTRAMUSCULAR

## 2023-09-26 NOTE — Progress Notes (Signed)
 Per orders of Aneta Bar, NP, #2 of 2 bi-weekly injection of B12 1000 mcg/ml given by Franne Ivory, CMA in Right Deltoid. Patient tolerated injection well.

## 2023-10-07 ENCOUNTER — Other Ambulatory Visit

## 2023-10-08 ENCOUNTER — Ambulatory Visit

## 2023-10-31 ENCOUNTER — Ambulatory Visit

## 2023-10-31 DIAGNOSIS — E538 Deficiency of other specified B group vitamins: Secondary | ICD-10-CM | POA: Diagnosis not present

## 2023-10-31 MED ORDER — CYANOCOBALAMIN 1000 MCG/ML IJ SOLN
1000.0000 ug | Freq: Once | INTRAMUSCULAR | Status: AC
  Administered 2023-10-31: 1000 ug via INTRAMUSCULAR

## 2023-10-31 NOTE — Progress Notes (Signed)
 Per orders of Mayra Reel, DPN AGNP-C, injection of vitamin b 12 given by Lewanda Rife in left deltoid. Patient tolerated injection well. Patient will make appointment for 1 month.

## 2023-11-05 ENCOUNTER — Other Ambulatory Visit

## 2023-11-07 ENCOUNTER — Other Ambulatory Visit

## 2023-11-07 ENCOUNTER — Ambulatory Visit

## 2023-11-28 ENCOUNTER — Ambulatory Visit (INDEPENDENT_AMBULATORY_CARE_PROVIDER_SITE_OTHER)

## 2023-11-28 DIAGNOSIS — E538 Deficiency of other specified B group vitamins: Secondary | ICD-10-CM

## 2023-11-28 MED ORDER — CYANOCOBALAMIN 1000 MCG/ML IJ SOLN
1000.0000 ug | Freq: Once | INTRAMUSCULAR | Status: AC
  Administered 2023-11-28: 1000 ug via INTRAMUSCULAR

## 2023-11-28 NOTE — Progress Notes (Signed)
Per orders of Mayra Reel, DPN AGNP-C, injection of vitamin b 12 given by Lewanda Rife in right deltoid. Patient tolerated injection well. Patient will make appointment for 1 month.

## 2023-12-03 ENCOUNTER — Ambulatory Visit

## 2023-12-06 ENCOUNTER — Other Ambulatory Visit

## 2023-12-10 ENCOUNTER — Ambulatory Visit

## 2024-01-07 ENCOUNTER — Ambulatory Visit

## 2024-01-22 ENCOUNTER — Ambulatory Visit (INDEPENDENT_AMBULATORY_CARE_PROVIDER_SITE_OTHER)

## 2024-01-22 DIAGNOSIS — E538 Deficiency of other specified B group vitamins: Secondary | ICD-10-CM | POA: Diagnosis not present

## 2024-01-22 MED ORDER — CYANOCOBALAMIN 1000 MCG/ML IJ SOLN
1000.0000 ug | Freq: Once | INTRAMUSCULAR | Status: AC
  Administered 2024-01-22: 1000 ug via INTRAMUSCULAR

## 2024-01-22 NOTE — Progress Notes (Signed)
 After obtaining consent, and per orders of Dr. Watt, injection of b-12 given IM in left deltoid by Sebastian Rosina Helling. Patient tolerated injection well.  Nurse visit sent to Dr. Watt due to Mallie Gaskins, NP being out of office today.  Patient scheduled for next nurse visit and follow up lab appointment per Kate's last OV.

## 2024-02-18 ENCOUNTER — Other Ambulatory Visit: Payer: Self-pay | Admitting: Primary Care

## 2024-02-18 DIAGNOSIS — E538 Deficiency of other specified B group vitamins: Secondary | ICD-10-CM

## 2024-02-25 ENCOUNTER — Ambulatory Visit (INDEPENDENT_AMBULATORY_CARE_PROVIDER_SITE_OTHER)

## 2024-02-25 ENCOUNTER — Other Ambulatory Visit (INDEPENDENT_AMBULATORY_CARE_PROVIDER_SITE_OTHER)

## 2024-02-25 DIAGNOSIS — E538 Deficiency of other specified B group vitamins: Secondary | ICD-10-CM

## 2024-02-25 MED ORDER — CYANOCOBALAMIN 1000 MCG/ML IJ SOLN
1000.0000 ug | Freq: Once | INTRAMUSCULAR | Status: AC
  Administered 2024-02-25: 1000 ug via INTRAMUSCULAR

## 2024-02-25 NOTE — Progress Notes (Signed)
 Per orders of Mallie Gaskins, DPN AGNP-C, injection of vitamin b 12 given by Laray Arenas in right deltoid. Patient tolerated injection well. Patient will make appointment if needed after gets lab results from today.

## 2024-02-26 ENCOUNTER — Ambulatory Visit: Payer: Self-pay | Admitting: Primary Care

## 2024-02-26 LAB — VITAMIN B12: Vitamin B-12: 226 pg/mL (ref 211–911)
